# Patient Record
Sex: Male | Born: 1960 | Race: White | Hispanic: No | Marital: Single | Smoking: Never smoker
Health system: Southern US, Community
[De-identification: ages and names within clinical notes are randomized; demographics above are authoritative.]

## PROBLEM LIST (undated history)

## (undated) DIAGNOSIS — E559 Vitamin D deficiency, unspecified: Secondary | ICD-10-CM

## (undated) DIAGNOSIS — B20 Human immunodeficiency virus [HIV] disease: Secondary | ICD-10-CM

## (undated) DIAGNOSIS — E785 Hyperlipidemia, unspecified: Secondary | ICD-10-CM

## (undated) DIAGNOSIS — E119 Type 2 diabetes mellitus without complications: Secondary | ICD-10-CM

## (undated) DIAGNOSIS — Z21 Asymptomatic human immunodeficiency virus [HIV] infection status: Secondary | ICD-10-CM

## (undated) HISTORY — DX: Hyperlipidemia, unspecified: E78.5

## (undated) HISTORY — DX: Type 2 diabetes mellitus without complications: E11.9

## (undated) HISTORY — DX: Human immunodeficiency virus (HIV) disease: B20

## (undated) HISTORY — DX: Asymptomatic human immunodeficiency virus (hiv) infection status: Z21

## (undated) HISTORY — PX: TONSILLECTOMY AND ADENOIDECTOMY: SHX28

## (undated) HISTORY — DX: Vitamin D deficiency, unspecified: E55.9

---

## 2013-05-10 ENCOUNTER — Other Ambulatory Visit: Payer: Self-pay

## 2013-05-10 ENCOUNTER — Telehealth: Payer: Self-pay

## 2013-05-10 DIAGNOSIS — B20 Human immunodeficiency virus [HIV] disease: Secondary | ICD-10-CM

## 2013-05-10 MED ORDER — EMTRICITABINE-TENOFOVIR DF 200-300 MG PO TABS: 1.0000 | ORAL_TABLET | Freq: Every day | ORAL | Status: DC

## 2013-05-10 MED ORDER — RALTEGRAVIR POTASSIUM 400 MG PO TABS: 400.0000 mg | ORAL_TABLET | Freq: Two times a day (BID) | ORAL | Status: DC

## 2013-05-10 NOTE — Telephone Encounter (Signed)
Pt calling to see if medical records have been received from [facility].  He is requesting to establish with a physician in our clinic. He has had several problem with previous providers and states we are the next closest ID clinic.  He only has 7 days of ART and his last office visit with ID was at least a year ago.   Pt is taking Truvada and Isentress. I have offered an intake appointment and patient says he will be traveling from [place] and does want to commute twice in one month for appointments.  I have scheduled him with Dr Luciana Axe since we have medical records and will send a 30 day script to pharmacy . He will need to have labs done at appointment and make arrangements with Dr Luciana Axe regarding future visits and labs.

## 2013-05-10 NOTE — Telephone Encounter (Signed)
Medications needed prior to visit with Dr Luciana Axe on 05-26-2013.

## 2013-05-26 ENCOUNTER — Encounter: Payer: Self-pay | Admitting: Internal Medicine

## 2013-05-26 ENCOUNTER — Ambulatory Visit (INDEPENDENT_AMBULATORY_CARE_PROVIDER_SITE_OTHER): Payer: BC Managed Care – PPO | Admitting: Internal Medicine

## 2013-05-26 VITALS — BP 143/87 | HR 88 | Temp 98.6°F | Ht 69.0 in | Wt 140.0 lb

## 2013-05-26 DIAGNOSIS — B20 Human immunodeficiency virus [HIV] disease: Secondary | ICD-10-CM | POA: Insufficient documentation

## 2013-05-26 DIAGNOSIS — E785 Hyperlipidemia, unspecified: Secondary | ICD-10-CM

## 2013-05-26 DIAGNOSIS — E119 Type 2 diabetes mellitus without complications: Secondary | ICD-10-CM | POA: Insufficient documentation

## 2013-05-26 DIAGNOSIS — E1169 Type 2 diabetes mellitus with other specified complication: Secondary | ICD-10-CM | POA: Insufficient documentation

## 2013-05-26 DIAGNOSIS — I10 Essential (primary) hypertension: Secondary | ICD-10-CM

## 2013-05-26 DIAGNOSIS — Z113 Encounter for screening for infections with a predominantly sexual mode of transmission: Secondary | ICD-10-CM

## 2013-05-26 LAB — COMPLETE METABOLIC PANEL WITH GFR
ALT: 53 U/L (ref 0–53)
AST: 33 U/L (ref 0–37)
Albumin: 5.2 g/dL (ref 3.5–5.2)
Alkaline Phosphatase: 54 U/L (ref 39–117)
Calcium: 10.6 mg/dL — ABNORMAL HIGH (ref 8.4–10.5)
Chloride: 102 mEq/L (ref 96–112)
GFR, Est Non African American: 76 mL/min
Glucose, Bld: 175 mg/dL — ABNORMAL HIGH (ref 70–99)
Potassium: 4.5 mEq/L (ref 3.5–5.3)
Sodium: 136 mEq/L (ref 135–145)

## 2013-05-26 MED ORDER — EMTRICITABINE-TENOFOVIR DF 200-300 MG PO TABS: 1.0000 | ORAL_TABLET | Freq: Every day | ORAL | Status: DC

## 2013-05-26 MED ORDER — RALTEGRAVIR POTASSIUM 400 MG PO TABS: 400.0000 mg | ORAL_TABLET | Freq: Two times a day (BID) | ORAL | Status: DC

## 2013-05-26 NOTE — Assessment & Plan Note (Signed)
His blood pressure is mildly elevated today. He'll monitor this and discuss with his primary doctor

## 2013-05-26 NOTE — Assessment & Plan Note (Signed)
He has done well with this regimen though has not had labs in quite some time. I will then check his labs today if there is any concerns she will be called. Otherwise she will return in about 4 months. I did refill his medications for 6 months. We'll check hepatitis A and B. Antibody to see if he does have some vaccination otherwise he will start the series next visit. We'll also have him send for mail Pap smear if he has not had that done by his primary doctor.

## 2013-05-26 NOTE — Assessment & Plan Note (Signed)
This is managed by his primary doctor so I will not monitor it here

## 2013-05-26 NOTE — Progress Notes (Signed)
  Subjective:    Patient ID: Cameron Humphrey, male    DOB: Feb 03, 1961, 52 y.o.   MRN: [id number]  HPI He comes in to establish care as a new patient. He has a long history of HIV from 51 History of early regimens but has done very well. She has no history of other sexual transmitted infections. No history of opportunistic infections and no history of thrush. He previously was on multiple regimens until getting on Isentress with Truvada and Lexiva.  It is unclear why he had 4 drug therapy with no known history of resistance but do 2 hyperlipidemia the Lexiva was dropped more than one year ago. He continues to take his 2 medications including the twice a day Isentress with no issues. He denies any missed doses. His last labs were done at [facility] infectious diseases and a CD4 count was 262 though viral load did not seem to get done. He left that clinic and has not been seen since August of 2013-2 and not liking the clinic and front staff. She does not recall any history of hepatitis A or B. Vaccine though does recall multiple vaccines and has had a recent Pneumovax. She does not get flu shots since he feels they make him sick. He has no hospitalizations. He otherwise has diabetes which he attributes to his increased tightness of the head in 2002 a stem from earlier HIV drugs. He also has hypertension. His primary care doctor is Dr. Layla Barter at [facility] family [facility] who manages his other issues. I do not see any vaccination history of hepatitis A and B. From his primary doctor although they did note that he was in a [place] that sounds like has not been back recently. He does travel from [place] so if his labs at the same time as his visit.   Review of Systems  Constitutional: Negative for fever, chills and fatigue.  HENT: Negative for trouble swallowing.   Eyes: Negative for visual disturbance.  Respiratory: Negative for cough and shortness of breath.   Cardiovascular: Negative for chest pain.  Gastrointestinal:  Negative for diarrhea and constipation.  Endocrine: Negative for polyuria.  Musculoskeletal: Negative for back pain.  Skin: Negative for rash.  Neurological: Negative for dizziness, light-headedness and headaches.  Hematological: Negative for adenopathy.  Psychiatric/Behavioral: Negative for sleep disturbance and dysphoric mood.       Objective:   Physical Exam  Constitutional: He is oriented to person, place, and time. He appears well-developed and well-nourished. No distress.  HENT:  He has temporal wasting  Eyes: Right eye exhibits no discharge. Left eye exhibits no discharge. No scleral icterus.  Cardiovascular: Normal rate, regular rhythm and normal heart sounds.   No murmur heard. Pulmonary/Chest: Effort normal and breath sounds normal. No respiratory distress. He has no wheezes.  Abdominal: Soft. Bowel sounds are normal. He exhibits no distension and no mass. There is no tenderness. There is no rebound.  Lymphadenopathy:    He has no cervical adenopathy.  Neurological: He is alert and oriented to person, place, and time.  Skin: Skin is warm and dry. No rash noted.  Psychiatric: He has a normal mood and affect. His behavior is normal.          Assessment & Plan:

## 2013-05-27 LAB — CBC WITH DIFFERENTIAL/PLATELET
Basophils Absolute: 0 10*3/uL (ref 0.0–0.1)
Eosinophils Absolute: 0.2 10*3/uL (ref 0.0–0.7)
Eosinophils Relative: 2 % (ref 0–5)
HCT: 42.7 % (ref 39.0–52.0)
Hemoglobin: 15.3 g/dL (ref 13.0–17.0)
Lymphocytes Relative: 19 % (ref 12–46)
Lymphs Abs: 1.3 10*3/uL (ref 0.7–4.0)
MCH: 32.3 pg (ref 26.0–34.0)
MCV: 90.3 fL (ref 78.0–100.0)
Monocytes Absolute: 0.6 10*3/uL (ref 0.1–1.0)
Monocytes Relative: 8 % (ref 3–12)
Platelets: 186 10*3/uL (ref 150–400)
RDW: 13.9 % (ref 11.5–15.5)
WBC: 6.8 10*3/uL (ref 4.0–10.5)

## 2013-05-27 LAB — HEPATITIS B SURFACE ANTIBODY,QUALITATIVE: Hep B S Ab: NEGATIVE

## 2013-05-27 LAB — TOXOPLASMA GONDII ANTIBODY, IGG: Toxoplasma IgG Ratio: <3 IU/mL (ref <7.2)

## 2013-05-27 LAB — HEPATITIS B SURFACE ANTIGEN: Hepatitis B Surface Ag: NEGATIVE

## 2013-05-27 LAB — T-HELPER CELL (CD4) - (RCID CLINIC ONLY): CD4 T Cell Abs: 280 /uL — ABNORMAL LOW (ref 400–2700)

## 2013-05-27 LAB — HEPATITIS A ANTIBODY, TOTAL: Hep A Total Ab: NONREACTIVE

## 2013-05-28 LAB — HIV-1 RNA QUANT-NO REFLEX-BLD
HIV 1 RNA Quant: <20 copies/mL (ref <20)
HIV-1 RNA Quant, Log: <1.3 {Log} (ref <1.30)

## 2013-05-28 LAB — QUANTIFERON TB GOLD ASSAY (BLOOD)
Interferon Gamma Release Assay: NEGATIVE
Mitogen value: 2.99 IU/mL
Quantiferon Nil Value: 0.02 IU/mL
Quantiferon Tb Ag Minus Nil Value: 0.01 IU/mL

## 2013-07-16 ENCOUNTER — Encounter: Payer: Self-pay | Admitting: Internal Medicine

## 2013-08-13 ENCOUNTER — Encounter: Payer: Self-pay | Admitting: *Deleted

## 2013-08-18 ENCOUNTER — Other Ambulatory Visit: Payer: Self-pay | Admitting: Licensed Clinical Social Worker

## 2013-08-18 ENCOUNTER — Encounter: Payer: Self-pay | Admitting: Internal Medicine

## 2013-08-18 DIAGNOSIS — B20 Human immunodeficiency virus [HIV] disease: Secondary | ICD-10-CM

## 2013-08-18 MED ORDER — RALTEGRAVIR POTASSIUM 400 MG PO TABS: 400.0000 mg | ORAL_TABLET | Freq: Two times a day (BID) | ORAL | Status: DC

## 2013-08-18 MED ORDER — EMTRICITABINE-TENOFOVIR DF 200-300 MG PO TABS
1.0000 | ORAL_TABLET | Freq: Every day | ORAL | Status: DC
Start: 2013-08-18 — End: 2013-09-13

## 2013-08-26 ENCOUNTER — Ambulatory Visit (INDEPENDENT_AMBULATORY_CARE_PROVIDER_SITE_OTHER): Payer: BC Managed Care – PPO | Admitting: Internal Medicine

## 2013-08-26 ENCOUNTER — Other Ambulatory Visit: Payer: Self-pay | Admitting: *Deleted

## 2013-08-26 VITALS — BP 124/83 | HR 76 | Temp 97.8°F | Wt 140.0 lb

## 2013-08-26 DIAGNOSIS — B20 Human immunodeficiency virus [HIV] disease: Secondary | ICD-10-CM

## 2013-08-26 NOTE — Progress Notes (Signed)
   Subjective:    Patient ID: Cameron Humphrey, male    DOB: 01/03/1961, 53 y.o.   MRN: [id number]  HPI  Here for follow up for HIV.  On Isentress and Truvada and denies any missed doses.  Feels well.  No weight loss, no diarrhea.  Happy with his regimen and not interested in changing to a more convenient regimen.     Review of Systems  Constitutional: Negative for fever and fatigue.  HENT: Negative for trouble swallowing.   Gastrointestinal: Negative for nausea and diarrhea.  Skin: Negative for rash.  Neurological: Negative for dizziness and light-headedness.  Hematological: Negative for adenopathy.       Objective:   Physical Exam  Constitutional: He appears well-developed and well-nourished. No distress.  HENT:  Some temporal wasting that is chronic  Eyes: No scleral icterus.  Cardiovascular: Normal rate, regular rhythm and normal heart sounds.   No murmur heard. Pulmonary/Chest: Effort normal and breath sounds normal. No respiratory distress. He has no wheezes.  Musculoskeletal: He exhibits no edema.  Lymphadenopathy:    He has no cervical adenopathy.  Skin: No rash noted.          Assessment & Plan:   

## 2013-08-26 NOTE — Assessment & Plan Note (Signed)
Doing well.  Labs today and rtc 4 months unless there are concerns.

## 2013-08-27 LAB — URINE CYTOLOGY ANCILLARY ONLY
Chlamydia: NEGATIVE
Neisseria Gonorrhea: NEGATIVE

## 2013-08-27 LAB — T-HELPER CELL (CD4) - (RCID CLINIC ONLY)
CD4 T CELL HELPER: 22 % — AB (ref 33–55)
CD4 T Cell Abs: 350 /uL — ABNORMAL LOW (ref 400–2700)

## 2013-08-30 ENCOUNTER — Encounter: Payer: Self-pay | Admitting: Internal Medicine

## 2013-08-30 LAB — HIV-1 RNA QUANT-NO REFLEX-BLD
HIV 1 RNA Quant: <20 copies/mL (ref <20)
HIV-1 RNA Quant, Log: <1.3 {Log} (ref <1.30)

## 2013-09-13 ENCOUNTER — Other Ambulatory Visit: Payer: Self-pay | Admitting: *Deleted

## 2013-09-13 ENCOUNTER — Encounter: Payer: Self-pay | Admitting: Internal Medicine

## 2013-09-13 DIAGNOSIS — B20 Human immunodeficiency virus [HIV] disease: Secondary | ICD-10-CM

## 2013-09-13 MED ORDER — RALTEGRAVIR POTASSIUM 400 MG PO TABS: 400.0000 mg | ORAL_TABLET | Freq: Two times a day (BID) | ORAL | Status: DC

## 2013-09-13 MED ORDER — EMTRICITABINE-TENOFOVIR DF 200-300 MG PO TABS: 1.0000 | ORAL_TABLET | Freq: Every day | ORAL | Status: DC

## 2013-12-29 ENCOUNTER — Encounter: Payer: Self-pay | Admitting: Internal Medicine

## 2013-12-29 ENCOUNTER — Ambulatory Visit (INDEPENDENT_AMBULATORY_CARE_PROVIDER_SITE_OTHER): Payer: BC Managed Care – PPO | Admitting: Internal Medicine

## 2013-12-29 VITALS — BP 162/94 | HR 77 | Temp 98.3°F | Ht 69.0 in | Wt 132.0 lb

## 2013-12-29 DIAGNOSIS — E785 Hyperlipidemia, unspecified: Secondary | ICD-10-CM | POA: Diagnosis not present

## 2013-12-29 DIAGNOSIS — Z23 Encounter for immunization: Secondary | ICD-10-CM

## 2013-12-29 DIAGNOSIS — B20 Human immunodeficiency virus [HIV] disease: Secondary | ICD-10-CM | POA: Diagnosis not present

## 2013-12-29 NOTE — Assessment & Plan Note (Signed)
Lipid panel followed by his PCP

## 2013-12-29 NOTE — Progress Notes (Signed)
   Subjective:    Patient ID: Cameron Humphrey, male    DOB: 05/23/1961, 53 y.o.   MRN: [id number]  HPI  Here for follow up for HIV.  On Isentress and Truvada and denies any missed doses.  Feels well.  No weight loss, no diarrhea.  Happy with his regimen and not interested in changing to a more convenient regimen.     Review of Systems  Constitutional: Negative for fever and fatigue.  HENT: Negative for trouble swallowing.   Gastrointestinal: Negative for nausea and diarrhea.  Skin: Negative for rash.  Neurological: Negative for dizziness and light-headedness.  Hematological: Negative for adenopathy.       Objective:   Physical Exam  Constitutional: He appears well-developed and well-nourished. No distress.  HENT:  Some temporal wasting that is chronic  Eyes: No scleral icterus.  Cardiovascular: Normal rate, regular rhythm and normal heart sounds.   No murmur heard. Pulmonary/Chest: Effort normal and breath sounds normal. No respiratory distress. He has no wheezes.  Musculoskeletal: He exhibits no edema.  Lymphadenopathy:    He has no cervical adenopathy.  Skin: No rash noted.          Assessment & Plan:

## 2013-12-29 NOTE — Assessment & Plan Note (Addendum)
Doing well.  Labs today and rtc 3 months unless there are concerns.   Hep A and B series starting today.   Will space it out to every 6 months if stable next visit.

## 2013-12-30 LAB — T-HELPER CELL (CD4) - (RCID CLINIC ONLY)
CD4 T CELL ABS: 330 /uL — AB (ref 400–2700)
CD4 T CELL HELPER: 23 % — AB (ref 33–55)

## 2013-12-31 LAB — HIV-1 RNA QUANT-NO REFLEX-BLD: HIV-1 RNA Quant, Log: <1.3 {Log} (ref <1.30)

## 2014-01-26 ENCOUNTER — Encounter: Payer: Self-pay | Admitting: Internal Medicine

## 2014-02-22 ENCOUNTER — Ambulatory Visit (INDEPENDENT_AMBULATORY_CARE_PROVIDER_SITE_OTHER): Payer: BC Managed Care – PPO | Admitting: *Deleted

## 2014-02-22 DIAGNOSIS — Z23 Encounter for immunization: Secondary | ICD-10-CM

## 2014-02-26 ENCOUNTER — Encounter: Payer: Self-pay | Admitting: Internal Medicine

## 2014-03-31 ENCOUNTER — Ambulatory Visit (INDEPENDENT_AMBULATORY_CARE_PROVIDER_SITE_OTHER): Payer: BC Managed Care – PPO | Admitting: Internal Medicine

## 2014-03-31 ENCOUNTER — Encounter: Payer: Self-pay | Admitting: Internal Medicine

## 2014-03-31 VITALS — BP 127/83 | HR 87 | Temp 97.4°F | Wt 131.0 lb

## 2014-03-31 DIAGNOSIS — B20 Human immunodeficiency virus [HIV] disease: Secondary | ICD-10-CM

## 2014-03-31 DIAGNOSIS — E119 Type 2 diabetes mellitus without complications: Secondary | ICD-10-CM | POA: Diagnosis not present

## 2014-03-31 DIAGNOSIS — Z79899 Other long term (current) drug therapy: Secondary | ICD-10-CM

## 2014-03-31 DIAGNOSIS — Z113 Encounter for screening for infections with a predominantly sexual mode of transmission: Secondary | ICD-10-CM | POA: Diagnosis not present

## 2014-03-31 MED ORDER — DOLUTEGRAVIR SODIUM 50 MG PO TABS: 50.0000 mg | ORAL_TABLET | Freq: Every day | ORAL | Status: DC

## 2014-03-31 MED ORDER — EMTRICITABINE-TENOFOVIR DF 200-300 MG PO TABS: 1.0000 | ORAL_TABLET | Freq: Every day | ORAL | Status: DC

## 2014-03-31 NOTE — Assessment & Plan Note (Signed)
Discussed regimen change and will change to Tivicay with truvada with next refill.  Has been sent.  RTC 4 months.

## 2014-03-31 NOTE — Progress Notes (Signed)
   Subjective:    Patient ID: Cameron Humphrey, male    DOB: 03-20-1961, 53 y.o.   MRN: [id number]  HPI Here for follow up for HIV.  On Isentress and Truvada and has occasional missed doses.  Feels well.  No weight loss, no diarrhea. Having trouble with low carb diet and not sure what variety he can eat.  Is interested in streamlining his regimen.       Review of Systems  Constitutional: Negative for fever and fatigue.  HENT: Negative for trouble swallowing.   Gastrointestinal: Negative for nausea and diarrhea.  Skin: Negative for rash.  Neurological: Negative for dizziness and light-headedness.  Hematological: Negative for adenopathy.       Objective:   Physical Exam  Constitutional: He appears well-developed and well-nourished. No distress.  HENT:  Some temporal wasting that is chronic  Eyes: No scleral icterus.  Cardiovascular: Normal rate, regular rhythm and normal heart sounds.   No murmur heard. Pulmonary/Chest: Effort normal and breath sounds normal. No respiratory distress. He has no wheezes.  Musculoskeletal: He exhibits no edema.  Lymphadenopathy:    He has no cervical adenopathy.  Skin: No rash noted.          Assessment & Plan:

## 2014-03-31 NOTE — Assessment & Plan Note (Signed)
Will refer to nutritionist.  

## 2014-04-01 ENCOUNTER — Telehealth: Payer: Self-pay | Admitting: *Deleted

## 2014-04-01 ENCOUNTER — Other Ambulatory Visit: Payer: Self-pay | Admitting: Internal Medicine

## 2014-04-01 LAB — CBC WITH DIFFERENTIAL/PLATELET
BASOS ABS: 0 10*3/uL (ref 0.0–0.1)
Basophils Relative: 0 % (ref 0–1)
Eosinophils Absolute: 0.2 10*3/uL (ref 0.0–0.7)
Eosinophils Relative: 5 % (ref 0–5)
HCT: 39.6 % (ref 39.0–52.0)
Hemoglobin: 13.7 g/dL (ref 13.0–17.0)
LYMPHS PCT: 25 % (ref 12–46)
Lymphs Abs: 1.2 10*3/uL (ref 0.7–4.0)
MCH: 31.6 pg (ref 26.0–34.0)
MCHC: 34.6 g/dL (ref 30.0–36.0)
MCV: 91.2 fL (ref 78.0–100.0)
Monocytes Absolute: 0.6 10*3/uL (ref 0.1–1.0)
Monocytes Relative: 13 % — ABNORMAL HIGH (ref 3–12)
NEUTROS ABS: 2.7 10*3/uL (ref 1.7–7.7)
NEUTROS PCT: 57 % (ref 43–77)
PLATELETS: 211 10*3/uL (ref 150–400)
RBC: 4.34 MIL/uL (ref 4.22–5.81)
RDW: 13.4 % (ref 11.5–15.5)
WBC: 4.7 10*3/uL (ref 4.0–10.5)

## 2014-04-01 LAB — COMPLETE METABOLIC PANEL WITH GFR
ALBUMIN: 4.9 g/dL (ref 3.5–5.2)
ALT: 25 U/L (ref 0–53)
AST: 20 U/L (ref 0–37)
Alkaline Phosphatase: 44 U/L (ref 39–117)
BUN: 25 mg/dL — AB (ref 6–23)
CALCIUM: 9.8 mg/dL (ref 8.4–10.5)
CO2: 22 mEq/L (ref 19–32)
CREATININE: 0.97 mg/dL (ref 0.50–1.35)
Chloride: 102 mEq/L (ref 96–112)
GFR, Est African American: >89 mL/min
GLUCOSE: 134 mg/dL — AB (ref 70–99)
POTASSIUM: 4.2 meq/L (ref 3.5–5.3)
Sodium: 135 mEq/L (ref 135–145)
Total Bilirubin: 0.6 mg/dL (ref 0.2–1.2)
Total Protein: 7.5 g/dL (ref 6.0–8.3)

## 2014-04-01 LAB — T-HELPER CELL (CD4) - (RCID CLINIC ONLY)
CD4 % Helper T Cell: 24 % — ABNORMAL LOW (ref 33–55)
CD4 T Cell Abs: 280 /uL — ABNORMAL LOW (ref 400–2700)

## 2014-04-01 LAB — RPR

## 2014-04-01 NOTE — Telephone Encounter (Signed)
Referral made to [facility]. They will call the patient to arrange date and time. Cameron Humphrey

## 2014-04-04 ENCOUNTER — Other Ambulatory Visit: Payer: Self-pay | Admitting: *Deleted

## 2014-04-04 LAB — HIV-1 RNA QUANT-NO REFLEX-BLD: HIV-1 RNA Quant, Log: <1.3 {Log} (ref <1.30)

## 2014-04-04 MED ORDER — ELVITEG-COBIC-EMTRICIT-TENOFDF 150-150-200-300 MG PO TABS: 1.0000 | ORAL_TABLET | Freq: Every day | ORAL | Status: DC

## 2014-04-06 LAB — HLA B*5701: HLA-B 5701 W/RFLX HLA-B HIGH: NEGATIVE

## 2014-04-07 ENCOUNTER — Encounter: Payer: Self-pay | Admitting: Internal Medicine

## 2014-04-12 ENCOUNTER — Encounter: Payer: BC Managed Care – PPO | Attending: Internal Medicine | Admitting: *Deleted

## 2014-04-12 ENCOUNTER — Encounter: Payer: Self-pay | Admitting: Internal Medicine

## 2014-04-12 ENCOUNTER — Encounter: Payer: Self-pay | Admitting: *Deleted

## 2014-04-12 VITALS — Ht 69.0 in | Wt 140.0 lb

## 2014-04-12 DIAGNOSIS — E119 Type 2 diabetes mellitus without complications: Secondary | ICD-10-CM | POA: Diagnosis present

## 2014-04-12 DIAGNOSIS — Z713 Dietary counseling and surveillance: Secondary | ICD-10-CM | POA: Diagnosis not present

## 2014-04-12 NOTE — Patient Instructions (Signed)
Plan:  Aim for 3 Carb Choices per meal (45 grams) +/- 1 either way  Aim for 0-2 Carbs per snack if hungry  Include protein in moderation with your meals and snacks Consider reading food labels for Total Carbohydrate of foods Continue with your activity level by walking for 2 miles daily as tolerated Continue taking medication as directed by MD

## 2014-04-20 NOTE — Progress Notes (Signed)
Diabetes Self-Management Education  Visit Type:  Initial  Appt. Start Time: 1400 Appt. End Time: 1530  04/20/2014  Mr. Cameron Humphrey, identified by name and date of birth, is a 53 y.o. male with a diagnosis of Diabetes: Type 2.  Other people present during visit:  Patient   ASSESSMENT  Height 5\' 9"  (1.753 m), weight 140 lb (63.504 kg). Body mass index is 20.67 kg/(m^2).  Initial Visit Information:  Are you currently following a meal plan?: No   Are you taking your medications as prescribed?: Yes Are you checking your feet?: Yes How many days per week are you checking your feet?: 5      Psychosocial:     Patient Belief/Attitude about Diabetes: Motivated to manage diabetes Self-care barriers: None Other persons present: Patient Patient Concerns: Nutrition/Meal planning;Medication Special Needs: None  Complications:   Last HgB A1C per patient/outside source: 6.7 mg/dL How often do you check your blood sugar?: 0 times/day (not testing) Have you had a dilated eye exam in the past 12 months?: Yes Have you had a dental exam in the past 12 months?: Yes  Diet Intake:  Breakfast: skip except for 2 cups of coffee with Splenda and fat free sugar free creamer Lunch: snack crackers or other food he might be interested in that day. Dinner: enjoys meal salads with lean meat, soups in the winter time, occasionally casserole with low GI. prefers whole grain flours, etc Beverage(s): coffee, water, [employer] water  Exercise:  Exercise: Light (walking / raking leaves) Light Exercise amount of time (min / week): 150  Individualized Plan for Diabetes Self-Management Training:   Learning Objective:  Patient will have a greater understanding of diabetes self-management.  Patient education plan per assessed needs and concerns is to attend individual sessions for     Education Topics Reviewed with Patient Today:  Definition of diabetes, type 1 and 2, and the diagnosis of  diabetes Role of diet in the treatment of diabetes and the relationship between the three main macronutrients and blood glucose level;Carbohydrate counting;Food label reading, portion sizes and measuring food. Role of exercise on diabetes management, blood pressure control and cardiac health. Reviewed patients medication for diabetes, action, purpose, timing of dose and side effects. Identified appropriate SMBG and/or A1C goals. Taught treatment of hypoglycemia - the 15 rule.   Role of stress on diabetes      PATIENTS GOALS/Plan (Developed by the patient):  Nutrition: Follow meal plan discussed Physical Activity: Exercise 3-5 times per week Medications: take my medication as prescribed Monitoring : test blood glucose pre and post meals as discussed  Plan:   Patient Instructions  Plan:  Aim for 3 Carb Choices per meal (45 grams) +/- 1 either way  Aim for 0-2 Carbs per snack if hungry  Include protein in moderation with your meals and snacks Consider reading food labels for Total Carbohydrate of foods Continue with your activity level by walking for 2 miles daily as tolerated Continue taking medication as directed by MD      Expected Outcomes:  Demonstrated interest in learning. Expect positive outcomes  Education material provided: Living Well with Diabetes, Food label handouts, A1C conversion sheet, Meal plan card and Carbohydrate counting sheet  If problems or questions, patient to contact team via:  Email  Future DSME appointment: 4-6 wks

## 2014-05-16 ENCOUNTER — Encounter: Payer: Self-pay | Admitting: Internal Medicine

## 2014-05-16 ENCOUNTER — Other Ambulatory Visit: Payer: Self-pay | Admitting: Internal Medicine

## 2014-05-16 MED ORDER — DOLUTEGRAVIR SODIUM 50 MG PO TABS: 50.0000 mg | ORAL_TABLET | Freq: Every day | ORAL | Status: DC

## 2014-05-16 MED ORDER — EMTRICITABINE-TENOFOVIR DF 200-300 MG PO TABS: 1.0000 | ORAL_TABLET | Freq: Every day | ORAL | Status: DC

## 2014-05-17 NOTE — Progress Notes (Signed)
Patient notified and he will pick up the Truvada and Tivicay today.

## 2014-05-31 ENCOUNTER — Ambulatory Visit: Payer: BC Managed Care – PPO | Admitting: *Deleted

## 2014-07-26 ENCOUNTER — Encounter: Payer: Self-pay | Admitting: Internal Medicine

## 2014-08-02 ENCOUNTER — Ambulatory Visit: Payer: BLUE CROSS/BLUE SHIELD | Admitting: Internal Medicine

## 2014-08-03 ENCOUNTER — Encounter: Payer: Self-pay | Admitting: Internal Medicine

## 2014-08-03 ENCOUNTER — Ambulatory Visit (INDEPENDENT_AMBULATORY_CARE_PROVIDER_SITE_OTHER): Payer: BLUE CROSS/BLUE SHIELD | Admitting: Internal Medicine

## 2014-08-03 VITALS — BP 146/81 | HR 77 | Temp 98.4°F | Ht 69.0 in | Wt 137.0 lb

## 2014-08-03 DIAGNOSIS — Z23 Encounter for immunization: Secondary | ICD-10-CM

## 2014-08-03 DIAGNOSIS — B20 Human immunodeficiency virus [HIV] disease: Secondary | ICD-10-CM | POA: Diagnosis not present

## 2014-08-03 NOTE — Addendum Note (Signed)
Addended by: Andree CossHOWELL, Alyvia Derk M on: 08/03/2014 03:36 PM   Modules accepted: Orders

## 2014-08-03 NOTE — Assessment & Plan Note (Signed)
He is doing well with his regimen of Tivicay and Truvada. No new issues. He'll get his CD4 and viral load today. Other labs were done by his primary physician and were reviewed and scanned. He will return in 4 months unless problems. He will get hep A #2, hep B #3, and Pneumovax today. He is due for a tetanus shot may consider that next time.

## 2014-08-03 NOTE — Progress Notes (Signed)
   Subjective:    Patient ID: Cameron MurrayJames Humphrey, male    DOB: 07/22/1960, 54 y.o.   MRN: [id number]  HPI Here for follow up for HIV.  On Isentress and Truvada and has occasional missed doses.  Feels well.  No weight loss, no diarrhea. Having trouble with low carb diet and not sure what variety he can eat.  Is interested in streamlining his regimen.       Review of Systems  Constitutional: Negative for fever and fatigue.  HENT: Negative for trouble swallowing.   Gastrointestinal: Negative for nausea and diarrhea.  Skin: Negative for rash.  Neurological: Negative for dizziness and light-headedness.  Hematological: Negative for adenopathy.       Objective:   Physical Exam  Constitutional: He appears well-developed and well-nourished. No distress.  HENT:  Some temporal wasting that is chronic  Eyes: No scleral icterus.  Cardiovascular: Normal rate, regular rhythm and normal heart sounds.   No murmur heard. Pulmonary/Chest: Effort normal and breath sounds normal. No respiratory distress. He has no wheezes.  Musculoskeletal: He exhibits no edema.  Lymphadenopathy:    He has no cervical adenopathy.  Skin: No rash noted.          Assessment & Plan:

## 2014-08-04 LAB — T-HELPER CELL (CD4) - (RCID CLINIC ONLY)
CD4 % Helper T Cell: 20 % — ABNORMAL LOW (ref 33–55)
CD4 T CELL ABS: 240 /uL — AB (ref 400–2700)

## 2014-08-05 LAB — HIV-1 RNA QUANT-NO REFLEX-BLD: HIV 1 RNA Quant: <20 copies/mL (ref <20)

## 2014-08-16 ENCOUNTER — Encounter: Payer: Self-pay | Admitting: Internal Medicine

## 2014-08-30 ENCOUNTER — Ambulatory Visit: Payer: BLUE CROSS/BLUE SHIELD | Admitting: Internal Medicine

## 2014-09-14 ENCOUNTER — Encounter: Payer: Self-pay | Admitting: Internal Medicine

## 2014-12-07 ENCOUNTER — Ambulatory Visit (INDEPENDENT_AMBULATORY_CARE_PROVIDER_SITE_OTHER): Payer: BLUE CROSS/BLUE SHIELD | Admitting: Internal Medicine

## 2014-12-07 ENCOUNTER — Encounter: Payer: Self-pay | Admitting: Internal Medicine

## 2014-12-07 VITALS — BP 129/83 | HR 72 | Temp 98.1°F | Wt 131.0 lb

## 2014-12-07 DIAGNOSIS — B20 Human immunodeficiency virus [HIV] disease: Secondary | ICD-10-CM | POA: Diagnosis not present

## 2014-12-07 NOTE — Assessment & Plan Note (Signed)
Doing well. Labs today and if no issues rtc in 6 months.

## 2014-12-07 NOTE — Progress Notes (Signed)
   Subjective:    Patient ID: Cameron MurrayJames Lippert, male    DOB: 07/25/1960, 54 y.o.   MRN: [id number]  HPI Here for follow up for HIV.  On Tivicay and Truvada and has no missed doses.  Feels well.  No weight loss, no diarrhea. Seeing an endocrinologist for his DM.  Brought labs in today for review. Gets CD4 and viral load done here and all other labs done by PCP/endo.  No new issues.  [place] and planning to build a house and move there in a year or so.    Review of Systems  Constitutional: Negative for fever and fatigue.  HENT: Negative for trouble swallowing.   Gastrointestinal: Negative for nausea and diarrhea.  Skin: Negative for rash.  Neurological: Negative for dizziness and light-headedness.  Hematological: Negative for adenopathy.       Objective:   Physical Exam  Constitutional: He appears well-developed and well-nourished. No distress.  HENT:  Some temporal wasting that is chronic  Eyes: No scleral icterus.  Cardiovascular: Normal rate, regular rhythm and normal heart sounds.   No murmur heard. Pulmonary/Chest: Effort normal and breath sounds normal. No respiratory distress. He has no wheezes.  Musculoskeletal: He exhibits no edema.  Lymphadenopathy:    He has no cervical adenopathy.  Skin: No rash noted.          Assessment & Plan:

## 2014-12-08 LAB — T-HELPER CELL (CD4) - (RCID CLINIC ONLY)
CD4 T CELL ABS: 260 /uL — AB (ref 400–2700)
CD4 T CELL HELPER: 19 % — AB (ref 33–55)

## 2014-12-09 LAB — HIV-1 RNA QUANT-NO REFLEX-BLD: HIV 1 RNA Quant: <20 copies/mL (ref <20)

## 2015-03-09 ENCOUNTER — Encounter: Payer: Self-pay | Admitting: Internal Medicine

## 2015-03-10 ENCOUNTER — Other Ambulatory Visit: Payer: Self-pay | Admitting: Internal Medicine

## 2015-03-10 MED ORDER — EMTRICITABINE-TENOFOVIR DF 200-300 MG PO TABS: 1.0000 | ORAL_TABLET | Freq: Every day | ORAL | Status: DC

## 2015-05-07 ENCOUNTER — Other Ambulatory Visit: Payer: Self-pay | Admitting: Internal Medicine

## 2015-05-08 ENCOUNTER — Other Ambulatory Visit: Payer: Self-pay | Admitting: *Deleted

## 2015-05-08 MED ORDER — DOLUTEGRAVIR SODIUM 50 MG PO TABS: 50.0000 mg | ORAL_TABLET | Freq: Every day | ORAL | Status: DC

## 2015-05-08 MED ORDER — EMTRICITABINE-TENOFOVIR DF 200-300 MG PO TABS: 1.0000 | ORAL_TABLET | Freq: Every day | ORAL | Status: DC

## 2015-06-06 ENCOUNTER — Ambulatory Visit (INDEPENDENT_AMBULATORY_CARE_PROVIDER_SITE_OTHER): Payer: BLUE CROSS/BLUE SHIELD | Admitting: Internal Medicine

## 2015-06-06 ENCOUNTER — Encounter: Payer: Self-pay | Admitting: Internal Medicine

## 2015-06-06 VITALS — BP 128/80 | HR 86 | Temp 98.1°F | Wt 141.0 lb

## 2015-06-06 DIAGNOSIS — E1169 Type 2 diabetes mellitus with other specified complication: Secondary | ICD-10-CM | POA: Diagnosis not present

## 2015-06-06 DIAGNOSIS — B20 Human immunodeficiency virus [HIV] disease: Secondary | ICD-10-CM | POA: Diagnosis not present

## 2015-06-06 DIAGNOSIS — E785 Hyperlipidemia, unspecified: Secondary | ICD-10-CM | POA: Diagnosis not present

## 2015-06-06 DIAGNOSIS — I10 Essential (primary) hypertension: Secondary | ICD-10-CM | POA: Diagnosis not present

## 2015-06-06 NOTE — Progress Notes (Signed)
   Subjective:    Patient ID: Alison MurrayJames Plaia, male    DOB: 08/03/1960, 54 y.o.   MRN: [id number]  HPI Here for follow up for HIV.      On Tivicay and Truvada and has no missed doses.  Feels well.  No weight loss, no diarrhea. Seeing an endocrinologist for his DM.  Brought labs in today for review and last A1C was 6.2. Gets CD4 and viral load done here and all other labs done by PCP/endo.  No new issues.  [place] and planning to [place] a house and move there next year or so.    Review of Systems  Constitutional: Negative for fever and fatigue.  HENT: Negative for trouble swallowing.   Gastrointestinal: Negative for nausea and diarrhea.  Skin: Negative for rash.  Neurological: Negative for dizziness and light-headedness.  Hematological: Negative for adenopathy.       Objective:   Physical Exam  Constitutional: He appears well-developed and well-nourished. No distress.  HENT:  Some temporal wasting that is chronic  Eyes: No scleral icterus.  Cardiovascular: Normal rate, regular rhythm and normal heart sounds.   No murmur heard. Pulmonary/Chest: Effort normal and breath sounds normal. No respiratory distress. He has no wheezes.  Musculoskeletal: He exhibits no edema.  Lymphadenopathy:    He has no cervical adenopathy.  Skin: No rash noted.          Assessment & Plan:

## 2015-06-06 NOTE — Assessment & Plan Note (Signed)
Stable BP on his medication

## 2015-06-06 NOTE — Assessment & Plan Note (Signed)
Followed by his PCP 

## 2015-06-06 NOTE — Assessment & Plan Note (Signed)
Doing well on his medications.  I will change Truvada to Descovy.  RTC 6 months.

## 2015-06-07 ENCOUNTER — Encounter: Payer: Self-pay | Admitting: Family Medicine

## 2015-06-07 LAB — T-HELPER CELL (CD4) - (RCID CLINIC ONLY)
CD4 % Helper T Cell: 19 % — ABNORMAL LOW (ref 33–55)
CD4 T Cell Abs: 270 /uL — ABNORMAL LOW (ref 400–2700)

## 2015-06-07 LAB — HIV-1 RNA QUANT-NO REFLEX-BLD: HIV-1 RNA Quant, Log: <1.3 Log copies/mL (ref <1.30)

## 2015-06-08 ENCOUNTER — Encounter: Payer: Self-pay | Admitting: Internal Medicine

## 2015-06-08 ENCOUNTER — Other Ambulatory Visit: Payer: Self-pay | Admitting: *Deleted

## 2015-06-08 ENCOUNTER — Telehealth: Payer: Self-pay | Admitting: *Deleted

## 2015-06-08 DIAGNOSIS — B20 Human immunodeficiency virus [HIV] disease: Secondary | ICD-10-CM

## 2015-06-08 MED ORDER — EMTRICITABINE-TENOFOVIR AF 200-25 MG PO TABS: 1.0000 | ORAL_TABLET | Freq: Every day | ORAL | Status: DC

## 2015-06-08 NOTE — Telephone Encounter (Signed)
OK to switch from Truvada to Descovy? Additionally, patient states that he is interested in studies, should he qualify for any. Andree CossHowell, Trapper Meech M, RN

## 2015-06-08 NOTE — Telephone Encounter (Signed)
Yes to Descovy. We talked about it but I forgot to send it. thanks

## 2015-06-09 ENCOUNTER — Ambulatory Visit (INDEPENDENT_AMBULATORY_CARE_PROVIDER_SITE_OTHER): Payer: BLUE CROSS/BLUE SHIELD | Admitting: Family Medicine

## 2015-06-09 ENCOUNTER — Encounter: Payer: Self-pay | Admitting: Family Medicine

## 2015-06-09 VITALS — BP 124/80 | HR 84 | Ht 69.0 in | Wt 136.6 lb

## 2015-06-09 DIAGNOSIS — E1169 Type 2 diabetes mellitus with other specified complication: Secondary | ICD-10-CM

## 2015-06-09 DIAGNOSIS — E118 Type 2 diabetes mellitus with unspecified complications: Secondary | ICD-10-CM

## 2015-06-09 DIAGNOSIS — E785 Hyperlipidemia, unspecified: Secondary | ICD-10-CM

## 2015-06-09 DIAGNOSIS — Z7189 Other specified counseling: Secondary | ICD-10-CM | POA: Diagnosis not present

## 2015-06-09 DIAGNOSIS — I10 Essential (primary) hypertension: Secondary | ICD-10-CM

## 2015-06-09 DIAGNOSIS — Z7689 Persons encountering health services in other specified circumstances: Secondary | ICD-10-CM

## 2015-06-09 LAB — CBC WITH DIFFERENTIAL/PLATELET
BASOS PCT: 0 % (ref 0–1)
Basophils Absolute: 0 10*3/uL (ref 0.0–0.1)
EOS ABS: 0.1 10*3/uL (ref 0.0–0.7)
Eosinophils Relative: 1 % (ref 0–5)
HCT: 34.2 % — ABNORMAL LOW (ref 39.0–52.0)
HEMOGLOBIN: 12 g/dL — AB (ref 13.0–17.0)
Lymphocytes Relative: 9 % — ABNORMAL LOW (ref 12–46)
Lymphs Abs: 0.7 10*3/uL (ref 0.7–4.0)
MCH: 33.3 pg (ref 26.0–34.0)
MCHC: 35.1 g/dL (ref 30.0–36.0)
MCV: 95 fL (ref 78.0–100.0)
MONO ABS: 0.8 10*3/uL (ref 0.1–1.0)
MONOS PCT: 10 % (ref 3–12)
MPV: 10.2 fL (ref 8.6–12.4)
NEUTROS ABS: 6.2 10*3/uL (ref 1.7–7.7)
NEUTROS PCT: 80 % — AB (ref 43–77)
PLATELETS: 196 10*3/uL (ref 150–400)
RBC: 3.6 MIL/uL — AB (ref 4.22–5.81)
RDW: 13.2 % (ref 11.5–15.5)
WBC: 7.7 10*3/uL (ref 4.0–10.5)

## 2015-06-09 LAB — COMPREHENSIVE METABOLIC PANEL
ALBUMIN: 4.8 g/dL (ref 3.6–5.1)
ALT: 15 U/L (ref 9–46)
AST: 16 U/L (ref 10–35)
Alkaline Phosphatase: 42 U/L (ref 40–115)
BILIRUBIN TOTAL: 0.7 mg/dL (ref 0.2–1.2)
BUN: 28 mg/dL — AB (ref 7–25)
CHLORIDE: 106 mmol/L (ref 98–110)
CO2: 21 mmol/L (ref 20–31)
CREATININE: 2.04 mg/dL — AB (ref 0.70–1.33)
Calcium: 9.9 mg/dL (ref 8.6–10.3)
Glucose, Bld: 109 mg/dL — ABNORMAL HIGH (ref 65–99)
Potassium: 4 mmol/L (ref 3.5–5.3)
SODIUM: 138 mmol/L (ref 135–146)
Total Protein: 7.6 g/dL (ref 6.1–8.1)

## 2015-06-09 LAB — HEMOGLOBIN A1C
HEMOGLOBIN A1C: 5.8 % — AB (ref <5.7)
MEAN PLASMA GLUCOSE: 120 mg/dL — AB (ref <117)

## 2015-06-09 LAB — LIPID PANEL
Cholesterol: 193 mg/dL (ref 125–200)
HDL: 35 mg/dL — ABNORMAL LOW (ref >=40)
LDL CALC: 118 mg/dL (ref <130)
TRIGLYCERIDES: 202 mg/dL — AB (ref <150)
Total CHOL/HDL Ratio: 5.5 Ratio — ABNORMAL HIGH (ref <=5.0)
VLDL: 40 mg/dL — AB (ref <30)

## 2015-06-09 NOTE — Progress Notes (Signed)
Subjective:    Patient ID: Cameron Humphrey, male    DOB: June 18, 1961, 54 y.o.   MRN: [id number]  HPI Chief Complaint  Patient presents with  . new pt    new pt get est no other concerns. would like Kamylle Axelson to start taking over DM- last A1c was 6.7% does not wants records transferred over due to privacy. does not feel well.   He is new to the practice and here to establish primary care. States he has been going to [facility] previously and lives in [place]. He states he stopped going to [facility] because he felt that did not like the fact that all of his providers were able to see his medical information, he states he told one provider some very "embarrasing" information and they documented that information in his chart and then other providers were able to see that information. He states he will never share this medical information with anyone else and states "I know I will probably die from it but it's so embarrassing that I will never tell anyone else this information and I will not allow gets my medical records for you". He states he has copies of the information he wants this practice to know about and will send them to [place].  He has seen Dr. Leslie Dales at [facility] and states he missed his appointment in September and will not be able to go back to that practice. He also sees Dr. Luciana Axe for HIV and is aware that I have access to this information, he states he is ok with this. He states his HIV is under good control and he is undetectable.   States he would like for me to take over his Diabetes and primary care.  States he rarely checks his blood sugar because he "cannot stand needles".  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 Also reports having hyperlipidemia and hypertriglyceridemia.   States he is UTD on immunizations but no records present.    Lives with his husband since 1999. He does not work outside the home.    Denies  drinking, smoking, states he smokes marijuana daily, in order to stimulate his appetite, but does not want that information in his chart. States he feels like the government can get his information and he does not like that. Is worried about computer hacking. He states "I will break your knuckles if you put that in my chart".   He denies fever, chills, chest pain, cough or shortness of breath.   Reviewed allergies, medications, past medical, surgical, and social history.   Review of Systems Pertinent positives and negatives in the history of present illness.    Objective:   Physical Exam  Alert and oriented and in no acute distress. Not otherwise examined BP 124/80 mmHg  Pulse 84  Ht  (1.753 m)  Wt 136 lb 9.6 oz (61.961 kg)  BMI 20.16 kg/m2     Assessment & Plan:  Type 2 diabetes mellitus with complication, without long-term current use of insulin (HCC) - Plan: CBC with Differential/Platelet, Comprehensive metabolic panel, Lipid panel, Hemoglobin A1c  Essential hypertension, benign - Plan: CBC with Differential/Platelet, Comprehensive metabolic panel, Lipid panel  Hyperlipidemia associated with type 2 diabetes mellitus (HCC) - Plan: CBC with Differential/Platelet, Comprehensive metabolic panel, Lipid panel  Encounter to establish care  Discussed that he must avoid using threatening language such as "break your knuckles" when talking to me, that  I will not tolerate this from him and will not be able to provide medical care for him if he continues to speak in this manner. Also discussed that it will be difficult and challenging to care for him without him being forthcoming with his previous medical information. Discussed that I will document necessary information in his medical record and that this is confidential except for other providers who need the information to provide care for him. Will need to follow up on hyperlipidemia once lab results are available. Blood pressure is  normal today.  Discussed that he does not seem willing to check blood sugars at home and this will make it difficult to determine if his medication regimen is effective or not.

## 2015-06-12 ENCOUNTER — Encounter: Payer: Self-pay | Admitting: Internal Medicine

## 2015-06-12 ENCOUNTER — Encounter: Payer: Self-pay | Admitting: *Deleted

## 2015-06-14 ENCOUNTER — Encounter: Payer: Self-pay | Admitting: Family Medicine

## 2015-06-15 ENCOUNTER — Encounter: Payer: Self-pay | Admitting: Family Medicine

## 2015-06-18 ENCOUNTER — Encounter: Payer: Self-pay | Admitting: Family Medicine

## 2015-06-20 ENCOUNTER — Ambulatory Visit (INDEPENDENT_AMBULATORY_CARE_PROVIDER_SITE_OTHER): Payer: BLUE CROSS/BLUE SHIELD | Admitting: Family Medicine

## 2015-06-20 ENCOUNTER — Encounter: Payer: Self-pay | Admitting: Family Medicine

## 2015-06-20 VITALS — BP 118/72 | HR 64 | Wt 139.0 lb

## 2015-06-20 DIAGNOSIS — R748 Abnormal levels of other serum enzymes: Secondary | ICD-10-CM

## 2015-06-20 DIAGNOSIS — R7989 Other specified abnormal findings of blood chemistry: Secondary | ICD-10-CM

## 2015-06-20 DIAGNOSIS — D649 Anemia, unspecified: Secondary | ICD-10-CM

## 2015-06-20 LAB — CBC WITH DIFFERENTIAL/PLATELET
BASOS PCT: 0 % (ref 0–1)
Basophils Absolute: 0 10*3/uL (ref 0.0–0.1)
Eosinophils Absolute: 0.3 10*3/uL (ref 0.0–0.7)
Eosinophils Relative: 3 % (ref 0–5)
HEMATOCRIT: 31.5 % — AB (ref 39.0–52.0)
HEMOGLOBIN: 10.8 g/dL — AB (ref 13.0–17.0)
LYMPHS ABS: 1.8 10*3/uL (ref 0.7–4.0)
LYMPHS PCT: 18 % (ref 12–46)
MCH: 32.4 pg (ref 26.0–34.0)
MCHC: 34.3 g/dL (ref 30.0–36.0)
MCV: 94.6 fL (ref 78.0–100.0)
MONO ABS: 0.6 10*3/uL (ref 0.1–1.0)
MONOS PCT: 6 % (ref 3–12)
MPV: 9.5 fL (ref 8.6–12.4)
NEUTROS ABS: 7.4 10*3/uL (ref 1.7–7.7)
NEUTROS PCT: 73 % (ref 43–77)
Platelets: 366 10*3/uL (ref 150–400)
RBC: 3.33 MIL/uL — ABNORMAL LOW (ref 4.22–5.81)
RDW: 13.6 % (ref 11.5–15.5)
WBC: 10.2 10*3/uL (ref 4.0–10.5)

## 2015-06-20 LAB — POCT URINALYSIS DIPSTICK
Bilirubin, UA: NEGATIVE
Blood, UA: NEGATIVE
GLUCOSE UA: NEGATIVE
KETONES UA: NEGATIVE
LEUKOCYTES UA: NEGATIVE
Nitrite, UA: NEGATIVE
Urobilinogen, UA: NEGATIVE
pH, UA: 6

## 2015-06-20 LAB — BASIC METABOLIC PANEL WITH GFR
BUN: 49 mg/dL — AB (ref 7–25)
CO2: 23 mmol/L (ref 20–31)
Calcium: 9.5 mg/dL (ref 8.6–10.3)
Chloride: 102 mmol/L (ref 98–110)
Creat: 2.4 mg/dL — ABNORMAL HIGH (ref 0.70–1.33)
GFR, EST AFRICAN AMERICAN: 34 mL/min — AB (ref >=60)
GFR, EST NON AFRICAN AMERICAN: 30 mL/min — AB (ref >=60)
GLUCOSE: 81 mg/dL (ref 65–99)
POTASSIUM: 4.2 mmol/L (ref 3.5–5.3)
Sodium: 136 mmol/L (ref 135–146)

## 2015-06-20 NOTE — Progress Notes (Signed)
   Subjective:    Patient ID: Cameron MurrayJames Wachtel, male    DOB: 01/16/1961, 54 y.o.   MRN: [id number]  HPI Chief Complaint  Patient presents with  . consult    consult on DM and discuss lab results.   He is here for follow-up on lab results. His creatinine was elevated at his last appointment and suspected possible dehydration or lab error.  He is on several HIV medications and states one of his medications was recently switched  2 of more kidney friendly medication. He states he is feeling well, has been eating better and drinking more fluids.  Denies fever, chills, unexplained weight loss ,cough, abdominal pain, back pain, or urinary symptoms.    Discussed that he also had a slightly low hemoglobin 12.0, denies history of anemia.  Discussed that his lipid panel was not much different but his total cholesterol, LDL and Triglycerides were mildly higher.  He reports good compliance with his cholesterol medication. Questions eating a healthier diet to improve this.   He reports he has loose stool when eating certain foods such as fruit, fruit juice, and lettuce.  This is ongoing for several years and unchanged, denies blood in stool.  He thinks this is related to some of his medications States he has never had colonoscopy and does not want one.    reviewed allergies, medications.   Review of Systems  pertinent positives and negatives in the history of present illness.    Objective:   Physical Exam BP 118/72 mmHg  Pulse 64  Wt 139 lb (63.05 kg)  alert and oriented and in no acute distress.  Not otherwise examined.   urinalysis dipstick trace of protein, negative otherwise.     Assessment & Plan:  Elevated serum creatinine - Plan: BASIC METABOLIC PANEL WITH GFR, POCT urinalysis dipstick  Anemia, unspecified anemia type - Plan: CBC with Differential/Platelet   discussed that we will repeat his blood work today to check his kidney function and hemoglobin. If he continues to have elevated  kidney function will consider renal ultrasound and possible referral.  If he continues to have evidence of anemia , we discussed starting a multivitamin with iron. Discussed that I recommend a Colonoscopy however since he refuses to have one of these I recommend screening for colon cancer with Cologuard. Patient provided with Cologuard information and he will let me know if he decides to do this.

## 2015-06-21 ENCOUNTER — Encounter: Payer: Self-pay | Admitting: Internal Medicine

## 2015-06-21 ENCOUNTER — Encounter: Payer: Self-pay | Admitting: Family Medicine

## 2015-06-21 NOTE — Addendum Note (Signed)
Addended by: [name], [name] A on: 06/21/2015 10:34 AM   Modules accepted: Orders

## 2015-06-26 ENCOUNTER — Ambulatory Visit
Admission: RE | Admit: 2015-06-26 | Discharge: 2015-06-26 | Disposition: A | Discharge status: Home / Self Care | Payer: BLUE CROSS/BLUE SHIELD | Source: Ambulatory Visit | Attending: Family Medicine | Admitting: Family Medicine

## 2015-06-26 ENCOUNTER — Telehealth: Payer: Self-pay | Admitting: Internal Medicine

## 2015-06-26 DIAGNOSIS — R7989 Other specified abnormal findings of blood chemistry: Secondary | ICD-10-CM

## 2015-06-26 NOTE — Telephone Encounter (Signed)
Pt is scheduled with [facility] on 07/21/15 @ 12:00pm with Dr. Elvis CoilMartin Webb. [address] [place] [phone]

## 2015-06-28 ENCOUNTER — Ambulatory Visit (INDEPENDENT_AMBULATORY_CARE_PROVIDER_SITE_OTHER): Payer: BLUE CROSS/BLUE SHIELD | Admitting: Family Medicine

## 2015-06-28 ENCOUNTER — Encounter: Payer: Self-pay | Admitting: Family Medicine

## 2015-06-28 VITALS — BP 130/80 | HR 68 | Wt 140.0 lb

## 2015-06-28 DIAGNOSIS — I10 Essential (primary) hypertension: Secondary | ICD-10-CM

## 2015-06-28 DIAGNOSIS — R809 Proteinuria, unspecified: Secondary | ICD-10-CM | POA: Diagnosis not present

## 2015-06-28 DIAGNOSIS — R748 Abnormal levels of other serum enzymes: Secondary | ICD-10-CM | POA: Diagnosis not present

## 2015-06-28 DIAGNOSIS — D649 Anemia, unspecified: Secondary | ICD-10-CM

## 2015-06-28 DIAGNOSIS — N182 Chronic kidney disease, stage 2 (mild): Secondary | ICD-10-CM | POA: Insufficient documentation

## 2015-06-28 DIAGNOSIS — R7989 Other specified abnormal findings of blood chemistry: Secondary | ICD-10-CM

## 2015-06-28 DIAGNOSIS — R710 Precipitous drop in hematocrit: Secondary | ICD-10-CM | POA: Insufficient documentation

## 2015-06-28 DIAGNOSIS — Z8639 Personal history of other endocrine, nutritional and metabolic disease: Secondary | ICD-10-CM | POA: Diagnosis not present

## 2015-06-28 LAB — POCT URINALYSIS DIPSTICK
Bilirubin, UA: NEGATIVE
Blood, UA: NEGATIVE
Glucose, UA: NEGATIVE
Ketones, UA: NEGATIVE
LEUKOCYTES UA: NEGATIVE
Nitrite, UA: NEGATIVE
Spec Grav, UA: >=1.03
UROBILINOGEN UA: NEGATIVE
pH, UA: 6

## 2015-06-28 NOTE — Progress Notes (Signed)
   Subjective:    Patient ID: Alison MurrayJames Buesing, male    DOB: 12/08/1960, 54 y.o.   MRN: [id number]  HPI Chief Complaint  Patient presents with  . follow-up    follow-up. urine and labs.    He is here for follow up on elevated creatinine, dehydration, and anemia. At his last appointment his urinalysis showed trace of protein and a specific gravity of 1.030.   Metformin and Benazapril were both stopped approximately 7 days ago.  He had a renal ultrasound that was normal however, the renal arteries were not included in this study.  Hemoglobin was decreased at his previous visit Creatinine was elevated at previous visit Blood pressure- benazapril stopped on 06/21/15 due to elevated Cr Metformin stopped on 06/21/15 due to elevated Cr  He states he has been eating well. He states he has been urinating a lot   Review of Systems Review of Systems Constitutional: -fever, -chills, -sweats, -unexpected weight change,-+mild fatigue ENT: -runny nose, -ear pain, -sore throat Cardiology:  -chest pain, -palpitations, -edema Respiratory: -cough, -shortness of breath, -wheezing Gastroenterology: -abdominal pain, -nausea, -vomiting, -diarrhea, -constipation Hematology: -bleeding or bruising problems Musculoskeletal: -arthralgias, -myalgias, -joint swelling, -back pain  Ophthalmology: -vision changes Urology: -dysuria, -difficulty urinating, -hematuria, +urinary frequency, -urgency Neurology: -headache, -weakness, -tingling, -numbness      Objective:   Physical Exam BP 130/80 mmHg  Pulse 68  Wt 140 lb (63.504 kg)  Alert and oriented and in no distress.  Skin warm and dry, no rash or lesions. Tympanic membranes and canals are normal. Pharyngeal area is normal. Mucous membranes moist.  Neck is supple without adenopathy or thyromegaly.  No supraclavicular or axillary adenopathy.Cardiac exam shows a regular sinus rhythm without murmurs or gallops. Lungs are clear to auscultation. Abdomen soft, non  distended, normal appearance, normal bowel sounds, no bruits or mass, no ascites,  No hepatosplenomegaly.  No CVA tenderness.  No LE edema. CN II-IX intact, no tremor, DTRs normal and symmetrical.    urinalysis dipstick- trace of protein, specific gravity 1.030.       Assessment & Plan:  Elevated serum creatinine - Plan: BASIC METABOLIC PANEL WITH GFR  Hemoglobin decreased - Plan: CBC  Essential hypertension, benign  Proteinuria - Plan: POCT urinalysis dipstick  Discussed patient with Dr. Susann GivensLalonde. Discussed with patient that even though our recommendation was to come in to this visit well hydrated that he is not hydrated based on his UA. He admits that he has not hydrated and has only consumed 3 16 ounce bottles of water for past 2 days. He states he is able to drink but just doesn't like to. Labs were cancelled since his urine show that his hydration status is unchanged from last week. Patient is aware that he should double his current water intake and return next week for repeat labs and UA. He will remain off Metformin and Benazapril until further work up next week. Blood pressure continues to be within goal off of medication. If he returns and is still dehydrated will plan to refer to nephrology.

## 2015-07-03 ENCOUNTER — Encounter: Payer: Self-pay | Admitting: Family Medicine

## 2015-07-13 LAB — COLOGUARD: Cologuard: NEGATIVE

## 2015-07-14 ENCOUNTER — Encounter: Payer: Self-pay | Admitting: Internal Medicine

## 2015-07-28 ENCOUNTER — Encounter: Payer: Self-pay | Admitting: Family Medicine

## 2015-08-22 LAB — HM DIABETES EYE EXAM

## 2015-11-18 ENCOUNTER — Encounter: Payer: Self-pay | Admitting: Family Medicine

## 2015-11-20 ENCOUNTER — Telehealth: Payer: Self-pay | Admitting: Medical

## 2015-11-20 MED ORDER — FENOFIBRATE MICRONIZED 134 MG PO CAPS: 134.0000 mg | ORAL_CAPSULE | Freq: Every day | ORAL | Status: DC

## 2015-11-20 MED ORDER — ATORVASTATIN CALCIUM 40 MG PO TABS: 40.0000 mg | ORAL_TABLET | Freq: Every day | ORAL | Status: DC

## 2015-11-20 MED ORDER — GLIMEPIRIDE 2 MG PO TABS: 2.0000 mg | ORAL_TABLET | Freq: Every day | ORAL | Status: DC

## 2015-11-20 MED ORDER — SITAGLIPTIN PHOSPHATE 100 MG PO TABS: 100.0000 mg | ORAL_TABLET | Freq: Every day | ORAL | Status: DC

## 2015-11-20 MED ORDER — SERTRALINE HCL 100 MG PO TABS: 100.0000 mg | ORAL_TABLET | Freq: Every day | ORAL | Status: DC

## 2015-11-20 NOTE — Telephone Encounter (Signed)
Refill those medications for 30 days, make him f/u appt, can order CMET, CBC no diff, HgbA1C, Lipid.

## 2015-11-20 NOTE — Telephone Encounter (Signed)
Refilled meds and faxed over lab orders to [facility]

## 2015-11-21 MED ORDER — SERTRALINE HCL 100 MG PO TABS: 100.0000 mg | ORAL_TABLET | Freq: Every day | ORAL | Status: DC

## 2015-11-21 MED ORDER — ATORVASTATIN CALCIUM 40 MG PO TABS: 40.0000 mg | ORAL_TABLET | Freq: Every day | ORAL | Status: DC

## 2015-11-21 MED ORDER — GLIMEPIRIDE 2 MG PO TABS: 2.0000 mg | ORAL_TABLET | Freq: Every day | ORAL | Status: DC

## 2015-11-21 MED ORDER — FENOFIBRATE MICRONIZED 134 MG PO CAPS: 134.0000 mg | ORAL_CAPSULE | Freq: Every day | ORAL | Status: DC

## 2015-11-21 NOTE — Addendum Note (Signed)
Addended by: [name], SABRINA A on: 11/21/2015 09:35 AM   Modules accepted: Orders

## 2015-11-21 NOTE — Addendum Note (Signed)
Addended by: [name], SABRINA A on: 11/21/2015 04:14 PM   Modules accepted: Orders

## 2015-11-21 NOTE — Telephone Encounter (Signed)
Fax came back that sertraline and atorvastatin will only be covered in a 90 days

## 2015-11-21 NOTE — Addendum Note (Signed)
Addended by: [name], Levy Wellman A on: 11/21/2015 08:53 AM   Modules accepted: Orders

## 2015-11-26 ENCOUNTER — Other Ambulatory Visit: Payer: Self-pay | Admitting: Internal Medicine

## 2015-11-28 ENCOUNTER — Encounter: Payer: Self-pay | Admitting: Internal Medicine

## 2015-11-30 ENCOUNTER — Encounter: Payer: Self-pay | Admitting: Family Medicine

## 2015-12-05 ENCOUNTER — Encounter: Payer: Self-pay | Admitting: Internal Medicine

## 2015-12-05 ENCOUNTER — Ambulatory Visit (INDEPENDENT_AMBULATORY_CARE_PROVIDER_SITE_OTHER): Payer: BLUE CROSS/BLUE SHIELD | Admitting: Internal Medicine

## 2015-12-05 VITALS — BP 154/95 | HR 83 | Temp 98.4°F | Wt 139.0 lb

## 2015-12-05 DIAGNOSIS — N182 Chronic kidney disease, stage 2 (mild): Secondary | ICD-10-CM | POA: Diagnosis not present

## 2015-12-05 DIAGNOSIS — B20 Human immunodeficiency virus [HIV] disease: Secondary | ICD-10-CM

## 2015-12-05 NOTE — Assessment & Plan Note (Signed)
Doing great.  Labs today and rtc 6 months unless concerns.  

## 2015-12-05 NOTE — Progress Notes (Signed)
   Subjective:    Patient ID: Cameron Humphrey, male    DOB: 07/27/1960, 55 y.o.   MRN: [id number]  HPI Here for follow up for HIV.      On Tivicay and Descovy and has no missed doses.  Feels well.  No weight loss, no diarrhea. Seeing an endocrinologist for his DM.  Brought labs in today for review and last A1C was unfortunately 9.4, which is up for him. Gets CD4 and viral load done here and all other labs done by PCP/endo.  Creat is 1.46 and stable.  Peak was about 2.  Thinks it is the years of HIV medications.  On kidney-friendly regimen with [facility].  Recently caring for his father-in-law who fell and poor control of his diet accounting for his elevated A1C.     Review of Systems  Constitutional: Negative for fever and fatigue.  HENT: Negative for trouble swallowing.   Gastrointestinal: Negative for nausea and diarrhea.  Skin: Negative for rash.  Neurological: Negative for dizziness and light-headedness.  Hematological: Negative for adenopathy.       Objective:   Physical Exam  Constitutional: He appears well-developed and well-nourished. No distress.  HENT:  Some temporal wasting that is chronic  Eyes: No scleral icterus.  Cardiovascular: Normal rate, regular rhythm and normal heart sounds.   No murmur heard. Pulmonary/Chest: Effort normal and breath sounds normal. No respiratory distress. He has no wheezes.  Musculoskeletal: He exhibits no edema.  Lymphadenopathy:    He has no cervical adenopathy.  Skin: No rash noted.          Assessment & Plan:

## 2015-12-05 NOTE — Assessment & Plan Note (Signed)
Stable creat.  I discussed this is likely from HTN, DM and HIV/HIV meds.  Seems stable and has follow up with Dr. Hyman HopesWebb tomorrow.

## 2015-12-06 ENCOUNTER — Ambulatory Visit (INDEPENDENT_AMBULATORY_CARE_PROVIDER_SITE_OTHER): Payer: BLUE CROSS/BLUE SHIELD | Admitting: Medical

## 2015-12-06 ENCOUNTER — Encounter: Payer: Self-pay | Admitting: Family Medicine

## 2015-12-06 ENCOUNTER — Encounter: Payer: Self-pay | Admitting: Medical

## 2015-12-06 VITALS — BP 132/94 | HR 99 | Wt 139.0 lb

## 2015-12-06 DIAGNOSIS — I10 Essential (primary) hypertension: Secondary | ICD-10-CM | POA: Diagnosis not present

## 2015-12-06 DIAGNOSIS — E118 Type 2 diabetes mellitus with unspecified complications: Secondary | ICD-10-CM | POA: Diagnosis not present

## 2015-12-06 DIAGNOSIS — E785 Hyperlipidemia, unspecified: Secondary | ICD-10-CM | POA: Diagnosis not present

## 2015-12-06 DIAGNOSIS — N182 Chronic kidney disease, stage 2 (mild): Secondary | ICD-10-CM | POA: Diagnosis not present

## 2015-12-06 DIAGNOSIS — E1169 Type 2 diabetes mellitus with other specified complication: Secondary | ICD-10-CM | POA: Diagnosis not present

## 2015-12-06 DIAGNOSIS — B20 Human immunodeficiency virus [HIV] disease: Secondary | ICD-10-CM

## 2015-12-06 LAB — T-HELPER CELL (CD4) - (RCID CLINIC ONLY)
CD4 T CELL HELPER: 22 % — AB (ref 33–55)
CD4 T Cell Abs: 290 /uL — ABNORMAL LOW (ref 400–2700)

## 2015-12-06 LAB — HIV-1 RNA QUANT-NO REFLEX-BLD
HIV 1 RNA Quant: <20 copies/mL (ref <20)
HIV-1 RNA Quant, Log: <1.3 Log copies/mL (ref <1.30)

## 2015-12-06 MED ORDER — SITAGLIPTIN PHOSPHATE 100 MG PO TABS: 100.0000 mg | ORAL_TABLET | Freq: Every day | ORAL | Status: DC

## 2015-12-06 MED ORDER — SERTRALINE HCL 100 MG PO TABS: 100.0000 mg | ORAL_TABLET | Freq: Every day | ORAL | Status: DC

## 2015-12-06 MED ORDER — FENOFIBRATE MICRONIZED 134 MG PO CAPS: 134.0000 mg | ORAL_CAPSULE | Freq: Every day | ORAL | Status: DC

## 2015-12-06 MED ORDER — ATORVASTATIN CALCIUM 40 MG PO TABS: 40.0000 mg | ORAL_TABLET | Freq: Every day | ORAL | Status: DC

## 2015-12-06 MED ORDER — GLIMEPIRIDE 2 MG PO TABS: 2.0000 mg | ORAL_TABLET | Freq: Every day | ORAL | Status: DC

## 2015-12-06 MED ORDER — FERROUS SULFATE 325 (65 FE) MG PO TABS: 325.0000 mg | ORAL_TABLET | Freq: Every day | ORAL | Status: DC

## 2015-12-06 NOTE — Addendum Note (Signed)
Addended by: Jac CanavanYSINGER, DAVID S on: 12/06/2015 02:59 PM   Modules accepted: Orders

## 2015-12-06 NOTE — Progress Notes (Addendum)
Subjective: Chief Complaint  Patient presents with  . med check    states all is okay, but had to stay with father in law due to him falling and said a1c was elevated because of food that was availlable where he was   Here for med check.  Is a patient of [facility] here.   Needs refills on all non HIV medication.  Was seeing endocrinology prior.    Has hx/o diabetes - takes Glimepiride  daily, Januvia  once daily, was taken of metformin temporarily due to elevated creatinine.  Doesn't check glucose, but does have meter.  Exercises some.   Diet - is usually good, but since January he has been taking care of his father in law who has dementia.  During first quarter of the year wasn't eating the best given circumstances.  Diagnosed 7 years ago.  No other medications prior.  Has HIV, sees Dr. Staci Righter here with [facility].  Has hypertension diagnosis, not on medication currently but was on Benazepril prior.  Diagnosed 6 years ago  hyperlipemia - compliant with Lipitor  daily, [place]  daily  Takes iron ferous sulfate, just started this last visit.  Has hx/o pancreatitis years ago, had problem with side effect with HIV medication.  Sees kidney doctor today in follow up.   Wants info about diet.  Past Medical History  Diagnosis Date  . HIV infection (HCC)   . Hyperlipidemia   . Diabetes mellitus without complication (HCC)    ROS as in subjective  Objective: BP 132/94 mmHg  Pulse 99  Wt 139 lb (63.05 kg)  Gen: wd, wn, nad Psych: pleasant good eye contact, answers questions appropriately Long winded  Labs from 11/28/15 showed HgbA1C of 9.4%, creatinine of 1.46, glucose 286.  Blood counts ok. HDL low.    Assessment: Encounter Diagnoses  Name Primary?  . Diabetes mellitus with complication (HCC) Yes  . Hyperlipidemia associated with type 2 diabetes mellitus (HCC)   . Human immunodeficiency virus (HIV) disease (HCC)   . Essential hypertension,  benign   . Chronic kidney disease (CKD) stage G2/A1, mildly decreased glomerular filtration rate (GFR) between 60-89 mL/min/1.73 square meter and albuminuria creatinine ratio less than 30 mg/g      Plan: He sees nephrology today in f/u.  I reviewed his 07/2015 visit notes.  Reviewed his recent labs.  HgbA1C, HDL and trig not at goal  Hypertension - will defer medication choice to nephrology whom he sees today  hyperlipidemia - c/t Lipitor and [place] for now.  Reassess in 34mo  CKD - f/u with nephrology today.  Gave copy of labs to take to nephrology today.  Diabetes - Discussed diet in great detail, discussed exercise.   He will c/t Glimepiride and Januvia.   Declines medication changes for now.  He feels strongly he can get diet under control and improve numbers over the next few months.  Of note he saw nutritionist within past year. HIV managed by infectious disease  See nephrology today, f/u here in 34mo fasting  Spent > 30 minutes face to face with patient in discussion of symptoms, evaluation, plan and recommendations.     Cameron Humphrey was seen today for med check.  Diagnoses and all orders for this visit:  Diabetes mellitus with complication (HCC)  Hyperlipidemia associated with type 2 diabetes mellitus (HCC)  Human immunodeficiency virus (HIV) disease (HCC)  Essential hypertension, benign  Chronic kidney disease (CKD) stage G2/A1, mildly decreased glomerular filtration rate (GFR) between 60-89  mL/min/1.73 square meter and albuminuria creatinine ratio less than 30 mg/g  Other orders -     sitaGLIPtin (JANUVIA) 100 MG tablet; Take 1 tablet (100 mg total) by mouth daily. -     sertraline (ZOLOFT) 100 MG tablet; Take 1 tablet (100 mg total) by mouth daily. -     fenofibrate micronized (LOFIBRA) 134 MG capsule; Take 1 capsule (134 mg total) by mouth daily before breakfast. -     glimepiride (AMARYL) 2 MG tablet; Take 1 tablet (2 mg total) by mouth daily with breakfast. -      ferrous sulfate 325 (65 FE) MG tablet; Take 1 tablet (325 mg total) by mouth daily with breakfast. -     atorvastatin (LIPITOR) 40 MG tablet; Take 1 tablet (40 mg total) by mouth daily.

## 2015-12-07 ENCOUNTER — Other Ambulatory Visit: Payer: Self-pay | Admitting: Medical

## 2015-12-07 MED ORDER — BENAZEPRIL HCL 10 MG PO TABS: 10.0000 mg | ORAL_TABLET | Freq: Every day | ORAL | Status: DC

## 2016-01-26 ENCOUNTER — Encounter: Payer: Self-pay | Admitting: Family Medicine

## 2016-02-28 ENCOUNTER — Encounter: Payer: Self-pay | Admitting: Family Medicine

## 2016-03-06 ENCOUNTER — Telehealth: Payer: Self-pay

## 2016-03-06 ENCOUNTER — Ambulatory Visit (INDEPENDENT_AMBULATORY_CARE_PROVIDER_SITE_OTHER): Payer: BLUE CROSS/BLUE SHIELD | Admitting: Family Medicine

## 2016-03-06 ENCOUNTER — Encounter: Payer: Self-pay | Admitting: Family Medicine

## 2016-03-06 VITALS — BP 120/80 | HR 67 | Wt 144.8 lb

## 2016-03-06 DIAGNOSIS — I10 Essential (primary) hypertension: Secondary | ICD-10-CM | POA: Diagnosis not present

## 2016-03-06 DIAGNOSIS — E1169 Type 2 diabetes mellitus with other specified complication: Secondary | ICD-10-CM

## 2016-03-06 DIAGNOSIS — E785 Hyperlipidemia, unspecified: Secondary | ICD-10-CM | POA: Diagnosis not present

## 2016-03-06 DIAGNOSIS — E118 Type 2 diabetes mellitus with unspecified complications: Secondary | ICD-10-CM | POA: Diagnosis not present

## 2016-03-06 DIAGNOSIS — N182 Chronic kidney disease, stage 2 (mild): Secondary | ICD-10-CM

## 2016-03-06 LAB — LIPID PANEL
CHOLESTEROL: 150 mg/dL (ref 125–200)
HDL: 25 mg/dL — ABNORMAL LOW (ref >=40)
LDL CALC: 69 mg/dL (ref <130)
Total CHOL/HDL Ratio: 6 Ratio — ABNORMAL HIGH (ref <=5.0)
Triglycerides: 282 mg/dL — ABNORMAL HIGH (ref <150)
VLDL: 56 mg/dL — AB (ref <30)

## 2016-03-06 LAB — HEMOGLOBIN A1C
HEMOGLOBIN A1C: 6.6 % — AB (ref <5.7)
Mean Plasma Glucose: 143 mg/dL

## 2016-03-06 NOTE — Telephone Encounter (Signed)
I do. 

## 2016-03-06 NOTE — Progress Notes (Signed)
   Subjective:    Patient ID: Cameron MurrayJames Humphrey, male    DOB: 12/28/1960, 55 y.o.   MRN: [id number]  HPI Chief Complaint  Patient presents with  . Other    follow-up    He is here for follow up on Diabetes, HTN and CKD  His A1C was 9.4% 3 months ago. States he was eating poorly for several weeks during that timeframe but has since improved his diet. He does not check his blood sugar at home. He is taking cinnamon, some sort of mixture of honey and stevia, and cider vinegar at home. States he is trying home remedies to control blood pressure and cholesterol. He is taking daily Glimepiride and Januvia and reports good compliance.   States blood pressures at home have been normal. States he does have a mild cough and thinks it's related to his blood pressure medication. States it is not bothersome so he does not want to do anything about it.  Denies fever, chills, headache, dizziness, chest pain, palpitations, shortness of breath, GI or GU symptoms. Denies any issues with vision or feet. Recently had some labs done at [facility], a BMP.   Reviewed allergies, medications, past medical, and social history.   Review of Systems Pertinent positives and negatives in the history of present illness.     Objective:   Physical Exam  Constitutional: He is oriented to person, place, and time. He appears well-developed and well-nourished. No distress.  Neck: Normal range of motion. Neck supple.  Cardiovascular: Normal rate, regular rhythm, normal heart sounds and intact distal pulses.   Pulmonary/Chest: Effort normal and breath sounds normal.  Musculoskeletal:       Right foot: Normal.       Left foot: Normal.  Neurological: He is alert and oriented to person, place, and time.  Skin: Skin is warm, dry and intact. No rash noted. No pallor.  Psychiatric: He has a normal mood and affect. His behavior is normal. Judgment and thought content normal.   BP 120/80   Pulse 67   Wt 144 lb 12.8 oz (65.7 kg)    BMI 21.38 kg/m       Assessment & Plan:  Diabetes mellitus with complication (HCC)  Chronic kidney disease (CKD) stage G2/A1, mildly decreased glomerular filtration rate (GFR) between 60-89 mL/min/1.73 square meter and albuminuria creatinine ratio less than 30 mg/g  Hyperlipidemia associated with type 2 diabetes mellitus (HCC)  Essential hypertension, benign  Diabetic foot exam performed and normal. Microalbumin test performed.  He reports having a recent eye exam and will get this documentation for us.  Reviewed BMP from 03/01/2016 and his creatinine is stable at 1.39. Labs sent to be scanned.  Continue seeing Dr. Luciana Axeomer for HIV and Dr. Hyman HopesWebb for CKD.  Discussed his blood pressure is within normal range and recommend he continue on current medication. Continue on current medication regimen for diabetes and may need to adjust this pending A1C and labs.  Fasting lipids ordered. Continue on current medication, atorvastain and fenofibrate for now.  Follow-up in 3-4 months for diabetes and hyperlipidemia.

## 2016-03-06 NOTE — Patient Instructions (Signed)
Continue on current medications. We will call you with lab results.  Basic Carbohydrate Counting for Diabetes Mellitus Carbohydrate counting is a method for keeping track of the amount of carbohydrates you eat. Eating carbohydrates naturally increases the level of sugar (glucose) in your blood, so it is important for you to know the amount that is okay for you to have in every meal. Carbohydrate counting helps keep the level of glucose in your blood within normal limits. The amount of carbohydrates allowed is different for every person. A dietitian can help you calculate the amount that is right for you. Once you know the amount of carbohydrates you can have, you can count the carbohydrates in the foods you want to eat. Carbohydrates are found in the following foods:  Grains, such as breads and cereals.  Dried beans and soy products.  Starchy vegetables, such as potatoes, peas, and corn.  Fruit and fruit juices.  Milk and yogurt.  Sweets and snack foods, such as cake, cookies, candy, chips, soft drinks, and fruit drinks. CARBOHYDRATE COUNTING There are two ways to count the carbohydrates in your food. You can use either of the methods or a combination of both. Reading the "Nutrition Facts" on Packaged Food The "Nutrition Facts" is an area that is included on the labels of almost all packaged food and beverages in the [place]. It includes the serving size of that food or beverage and information about the nutrients in each serving of the food, including the grams (g) of carbohydrate per serving.  Decide the number of servings of this food or beverage that you will be able to eat or drink. Multiply that number of servings by the number of grams of carbohydrate that is listed on the label for that serving. The total will be the amount of carbohydrates you will be having when you eat or drink this food or beverage. Learning Standard Serving Sizes of Food When you eat food that is not  packaged or does not include "Nutrition Facts" on the label, you need to measure the servings in order to count the amount of carbohydrates.A serving of most carbohydrate-rich foods contains about 15 g of carbohydrates. The following list includes serving sizes of carbohydrate-rich foods that provide 15 g ofcarbohydrate per serving:   1 slice of bread (1 oz) or 1 six-inch tortilla.    of a hamburger bun or English muffin.  4-6 crackers.   cup unsweetened dry cereal.    cup hot cereal.   cup rice or pasta.    cup mashed potatoes or  of a large baked potato.  1 cup fresh fruit or one small piece of fruit.    cup canned or frozen fruit or fruit juice.  1 cup milk.   cup plain fat-free yogurt or yogurt sweetened with artificial sweeteners.   cup cooked dried beans or starchy vegetable, such as peas, corn, or potatoes.  Decide the number of standard-size servings that you will eat. Multiply that number of servings by 15 (the grams of carbohydrates in that serving). For example, if you eat 2 cups of strawberries, you will have eaten 2 servings and 30 g of carbohydrates (2 servings x 15 g = 30 g). For foods such as soups and casseroles, in which more than one food is mixed in, you will need to count the carbohydrates in each food that is included. EXAMPLE OF CARBOHYDRATE COUNTING Sample Dinner  3 oz chicken breast.   cup of brown rice.   cup  of corn.  1 cup milk.   1 cup strawberries with sugar-free whipped topping.  Carbohydrate Calculation Step 1: Identify the foods that contain carbohydrates:   Rice.   Corn.   Milk.   Strawberries. Step 2:Calculate the number of servings eaten of each:   2 servings of rice.   1 serving of corn.   1 serving of milk.   1 serving of strawberries. Step 3: Multiply each of those number of servings by 15 g:   2 servings of rice x 15 g = 30 g.   1 serving of corn x 15 g = 15 g.   1 serving of milk x 15  g = 15 g.   1 serving of strawberries x 15 g = 15 g. Step 4: Add together all of the amounts to find the total grams of carbohydrates eaten: 30 g + 15 g + 15 g + 15 g = 75 g.   This information is not intended to replace advice given to you by your health care provider. Make sure you discuss any questions you have with your health care provider.   Document Released: 06/24/2005 Document Revised: 07/15/2014 Document Reviewed: 05/21/2013 Elsevier Interactive Patient Education [employer].

## 2016-03-06 NOTE — Telephone Encounter (Signed)
Pt calling to see if you have labs on him from [facility]?

## 2016-03-07 ENCOUNTER — Encounter: Payer: Self-pay | Admitting: Family Medicine

## 2016-03-07 LAB — MICROALBUMIN / CREATININE URINE RATIO
Creatinine, Urine: 65 mg/dL (ref 20–370)
MICROALB UR: 0.6 mg/dL
MICROALB/CREAT RATIO: 9 ug/mg{creat} (ref <30)

## 2016-04-24 ENCOUNTER — Other Ambulatory Visit: Payer: Self-pay | Admitting: Medical

## 2016-04-29 ENCOUNTER — Encounter: Payer: Self-pay | Admitting: Family Medicine

## 2016-05-20 ENCOUNTER — Other Ambulatory Visit: Payer: Self-pay | Admitting: Internal Medicine

## 2016-05-29 ENCOUNTER — Other Ambulatory Visit: Payer: Self-pay | Admitting: Medical

## 2016-06-06 ENCOUNTER — Ambulatory Visit (INDEPENDENT_AMBULATORY_CARE_PROVIDER_SITE_OTHER): Payer: BLUE CROSS/BLUE SHIELD | Admitting: Internal Medicine

## 2016-06-06 ENCOUNTER — Encounter: Payer: Self-pay | Admitting: Internal Medicine

## 2016-06-06 VITALS — BP 133/76 | HR 69 | Temp 98.1°F | Wt 144.0 lb

## 2016-06-06 DIAGNOSIS — N182 Chronic kidney disease, stage 2 (mild): Secondary | ICD-10-CM | POA: Diagnosis not present

## 2016-06-06 DIAGNOSIS — B20 Human immunodeficiency virus [HIV] disease: Secondary | ICD-10-CM

## 2016-06-06 NOTE — Progress Notes (Signed)
   Subjective:    Patient ID: Cameron MurrayJames Bates, male    DOB: 08/08/1960, 55 y.o.   MRN: [id number]  HPI Here for follow up for HIV.      On Tivicay and Descovy and has no missed doses.  Feels well.  No weight loss, no diarrhea. Seeing an endocrinologist for his DM.  Brought labs in today for review and last A1C was unfortunately 9.4, which is up for him. Gets CD4 and viral load done here and all other labs done by PCP/endo.  Creat is 1.46 and stable.  Peak was about 2.  Thinks it is the years of HIV medications.  On kidney-friendly regimen with [facility].   [place] and he is hopeful to move in the Spring.   Review of Systems  Constitutional: Negative for fatigue and fever.  HENT: Negative for trouble swallowing.   Gastrointestinal: Negative for diarrhea and nausea.  Skin: Negative for rash.  Neurological: Negative for dizziness and light-headedness.  Hematological: Negative for adenopathy.       Objective:   Physical Exam  Constitutional: He appears well-developed and well-nourished. No distress.  HENT:  Some temporal wasting that is chronic  Eyes: No scleral icterus.  Cardiovascular: Normal rate, regular rhythm and normal heart sounds.   No murmur heard. Pulmonary/Chest: Effort normal and breath sounds normal. No respiratory distress. He has no wheezes.  Musculoskeletal: He exhibits no edema.  Lymphadenopathy:    He has no cervical adenopathy.  Skin: No rash noted.          Assessment & Plan:

## 2016-06-06 NOTE — Assessment & Plan Note (Signed)
Doing great.  Labs today and rtc 6 months unless he has moved.

## 2016-06-06 NOTE — Assessment & Plan Note (Signed)
Stable. No dose adjustment needed at current creat.

## 2016-06-07 ENCOUNTER — Ambulatory Visit (INDEPENDENT_AMBULATORY_CARE_PROVIDER_SITE_OTHER): Payer: BLUE CROSS/BLUE SHIELD | Admitting: Family Medicine

## 2016-06-07 ENCOUNTER — Encounter: Payer: Self-pay | Admitting: Family Medicine

## 2016-06-07 VITALS — BP 130/70 | HR 63 | Resp 16 | Wt 145.8 lb

## 2016-06-07 DIAGNOSIS — I10 Essential (primary) hypertension: Secondary | ICD-10-CM | POA: Diagnosis not present

## 2016-06-07 DIAGNOSIS — E118 Type 2 diabetes mellitus with unspecified complications: Secondary | ICD-10-CM

## 2016-06-07 DIAGNOSIS — N182 Chronic kidney disease, stage 2 (mild): Secondary | ICD-10-CM

## 2016-06-07 DIAGNOSIS — E782 Mixed hyperlipidemia: Secondary | ICD-10-CM | POA: Diagnosis not present

## 2016-06-07 LAB — CBC WITH DIFFERENTIAL/PLATELET
BASOS ABS: 0 {cells}/uL (ref 0–200)
BASOS PCT: 0 %
EOS PCT: 5 %
Eosinophils Absolute: 225 cells/uL (ref 15–500)
HCT: 38.2 % — ABNORMAL LOW (ref 38.5–50.0)
HEMOGLOBIN: 12.8 g/dL — AB (ref 13.2–17.1)
LYMPHS ABS: 1395 {cells}/uL (ref 850–3900)
Lymphocytes Relative: 31 %
MCH: 31.4 pg (ref 27.0–33.0)
MCHC: 33.5 g/dL (ref 32.0–36.0)
MCV: 93.6 fL (ref 80.0–100.0)
MPV: 11.3 fL (ref 7.5–12.5)
Monocytes Absolute: 495 cells/uL (ref 200–950)
Monocytes Relative: 11 %
NEUTROS ABS: 2385 {cells}/uL (ref 1500–7800)
Neutrophils Relative %: 53 %
Platelets: 184 10*3/uL (ref 140–400)
RBC: 4.08 MIL/uL — AB (ref 4.20–5.80)
RDW: 12.9 % (ref 11.0–15.0)
WBC: 4.5 10*3/uL (ref 4.0–10.5)

## 2016-06-07 LAB — COMPLETE METABOLIC PANEL WITH GFR
ALBUMIN: 4.8 g/dL (ref 3.6–5.1)
ALK PHOS: 29 U/L — AB (ref 40–115)
ALT: 17 U/L (ref 9–46)
AST: 20 U/L (ref 10–35)
BILIRUBIN TOTAL: 0.6 mg/dL (ref 0.2–1.2)
BUN: 32 mg/dL — AB (ref 7–25)
CO2: 25 mmol/L (ref 20–31)
CREATININE: 1.51 mg/dL — AB (ref 0.70–1.33)
Calcium: 9.9 mg/dL (ref 8.6–10.3)
Chloride: 105 mmol/L (ref 98–110)
GFR, Est African American: 60 mL/min (ref >=60)
GFR, Est Non African American: 52 mL/min — ABNORMAL LOW (ref >=60)
GLUCOSE: 101 mg/dL — AB (ref 65–99)
Potassium: 4.3 mmol/L (ref 3.5–5.3)
SODIUM: 138 mmol/L (ref 135–146)
TOTAL PROTEIN: 7.5 g/dL (ref 6.1–8.1)

## 2016-06-07 LAB — TSH: TSH: 2.05 mIU/L (ref 0.40–4.50)

## 2016-06-07 LAB — LIPID PANEL
Cholesterol: 139 mg/dL (ref <200)
HDL: 24 mg/dL — ABNORMAL LOW (ref >40)
LDL CALC: 56 mg/dL (ref <100)
Total CHOL/HDL Ratio: 5.8 Ratio — ABNORMAL HIGH (ref <5.0)
Triglycerides: 293 mg/dL — ABNORMAL HIGH (ref <150)
VLDL: 59 mg/dL — AB (ref <30)

## 2016-06-07 LAB — POCT GLYCOSYLATED HEMOGLOBIN (HGB A1C): HEMOGLOBIN A1C: 6.4

## 2016-06-07 LAB — T-HELPER CELL (CD4) - (RCID CLINIC ONLY)
CD4 % Helper T Cell: 21 % — ABNORMAL LOW (ref 33–55)
CD4 T Cell Abs: 350 /uL — ABNORMAL LOW (ref 400–2700)

## 2016-06-07 NOTE — Progress Notes (Deleted)
   Subjective:    Patient ID: Cameron MurrayJames Luckey, male    DOB: 02/26/1961, 55 y.o.   MRN: [id number]  HPI    Review of Systems     Objective:   Physical Exam        Assessment & Plan:

## 2016-06-07 NOTE — Progress Notes (Signed)
Subjective:    Patient ID: Cameron Humphrey Azam, male    DOB: 01/17/1961, 55 y.o.   MRN: [id number]  Cameron Humphrey Shappell is a 55 y.o. male who presents for follow-up of Type 2 diabetes mellitus. No concerns or complaints today.   Other providers: [facility] -Dr. Hyman HopesWebb. States he has not been there in months and states he was told everything was stable. He was called to schedule a f/u appt and states he does not think he needs to go back anytime soon.  He cut out caffeine. Is eating a healthy diet.   Patient is not checking home blood sugars.   Home blood sugar records: none How often is blood sugars being checked: none Current symptoms include: polyuria. Patient denies other sx  Patient is checking their feet daily. Any Foot concerns (callous, ulcer, wound, thickened nails, toenail fungus, skin fungus, hammer toe): none Last dilated eye exam: Feb 2017  Current treatments: medication and low carb and cholesterol diet. Medication compliance: good  Current diet: in general, a "healthy" diet   Current exercise: none Known diabetic complications: nephropathy  The following portions of the patient's history were reviewed and updated as appropriate: allergies, current medications, past medical history, past social history and problem list.  ROS as in subjective above.     Objective:    Physical Exam Alert and in no distress otherwise not examined.  Blood pressure 130/70, pulse 63, resp. rate 16, weight 145 lb 12.8 oz (66.1 kg), SpO2 98 %.  Lab Review Diabetic Labs Latest Ref Rng & Units 06/07/2016 03/06/2016 06/20/2015 06/09/2015 03/31/2014  HbA1c - 6.4 6.6(H) - 5.8(H) -  Microalbumin Not estab mg/dL - 0.6 - - -  Micro/Creat Ratio <30 mcg/mg creat - 9 - - -  Chol 125 - 200 mg/dL - 409150 - 811193 -  HDL >=91>=40 mg/dL - 47(W25(L) - 29(F35(L) -  Calc LDL <130 mg/dL - 69 - 621118 -  Triglycerides <150 mg/dL - 308(M282(H) - 578(I202(H) -  Creatinine 0.70 - 1.33 mg/dL - - 6.96(E2.40(H) 9.52(W2.04(H) 4.130.97   BP/Weight 06/07/2016 06/06/2016  03/06/2016 12/06/2015 12/05/2015  Systolic BP 130 133 120 132 154  Diastolic BP 70 76 80 94 95  Wt. (Lbs) 145.8 144 144.8 139 139  BMI 21.53 21.27 21.38 20.52 20.52   Foot/eye exam completion dates Latest Ref Rng & Units 03/06/2016 08/22/2015  Eye Exam No Retinopathy - No Retinopathy  Foot Form Completion - Done -    Fayrene FearingJames  reports that he has never smoked. He has never used smokeless tobacco. He reports that he does not drink alcohol or use drugs.     Assessment & Plan:    Diabetes mellitus with complication (HCC) - Plan: HgB A1c, CBC with Differential/Platelet, COMPLETE METABOLIC PANEL WITH GFR, TSH  Essential hypertension, benign  Chronic kidney disease (CKD) stage G2/A1, mildly decreased glomerular filtration rate (GFR) between 60-89 mL/min/1.73 square meter and albuminuria creatinine ratio less than 30 mg/g - Plan: COMPLETE METABOLIC PANEL WITH GFR  Mixed hyperlipidemia - Plan: Lipid panel  1. Rx changes: none A1C is 6.4% and within goal.  2. Recheck of lipids and may need to alter medication if not improving.  3. Blood pressure is within goal. Continue on current medication.  4. Education: Reviewed 'ABCs' of diabetes management (respective goals in parentheses):  A1C (<7), blood pressure (<130/80), and cholesterol (LDL <100). 5. Compliance at present is estimated to be good. Efforts to improve compliance (if necessary) will be directed at dietary modifications: low sugar, carb  and fat and increased exercise. 6. Follow up: 3 months

## 2016-06-10 LAB — HIV-1 RNA QUANT-NO REFLEX-BLD

## 2016-06-16 ENCOUNTER — Other Ambulatory Visit: Payer: Self-pay | Admitting: Internal Medicine

## 2016-06-16 DIAGNOSIS — B20 Human immunodeficiency virus [HIV] disease: Secondary | ICD-10-CM

## 2016-07-02 ENCOUNTER — Other Ambulatory Visit: Payer: Self-pay | Admitting: Medical

## 2016-08-05 ENCOUNTER — Encounter: Payer: Self-pay | Admitting: Family Medicine

## 2016-08-14 ENCOUNTER — Other Ambulatory Visit: Payer: Self-pay | Admitting: Family Medicine

## 2016-08-14 ENCOUNTER — Other Ambulatory Visit: Payer: Self-pay | Admitting: Medical

## 2016-08-25 ENCOUNTER — Other Ambulatory Visit: Payer: Self-pay | Admitting: Medical

## 2016-09-15 ENCOUNTER — Other Ambulatory Visit: Payer: Self-pay | Admitting: Medical

## 2016-09-16 NOTE — Telephone Encounter (Signed)
Can this patient have a refill on this ? 

## 2016-09-17 MED ORDER — SERTRALINE HCL 100 MG PO TABS: 100.0000 mg | ORAL_TABLET | Freq: Every day | ORAL | 1 refills | Status: DC

## 2016-09-17 NOTE — Telephone Encounter (Signed)
done

## 2016-09-17 NOTE — Telephone Encounter (Signed)
Is this okay to refill? 

## 2016-09-17 NOTE — Telephone Encounter (Signed)
Ok to refill 

## 2016-09-17 NOTE — Addendum Note (Signed)
Addended by: [name], Quavion Boule A on: 09/17/2016 09:09 AM   Modules accepted: Orders

## 2016-09-21 IMAGING — US US RENAL
1 series · 14 of 25 positions shown · non-contrast
Comparison: None in PACs

CLINICAL DATA: Elevated serum creatinine, history of hypertension

EXAM:
RENAL / URINARY TRACT ULTRASOUND COMPLETE

[Series 1: us renal · 0.26mm/px · 14 of 26 slices shown]
[im 1/26]
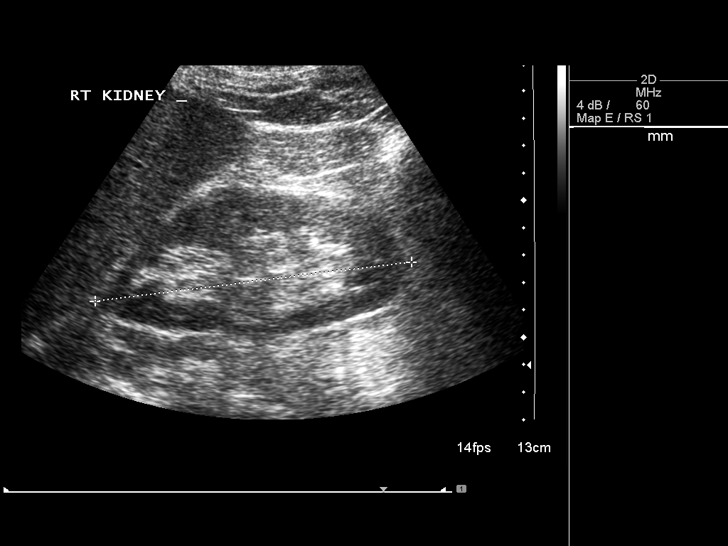
[im 3/26]
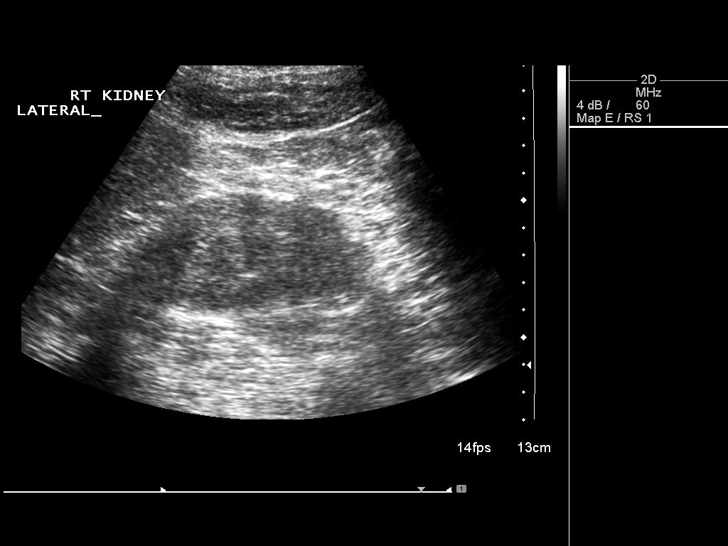
[im 5/26]
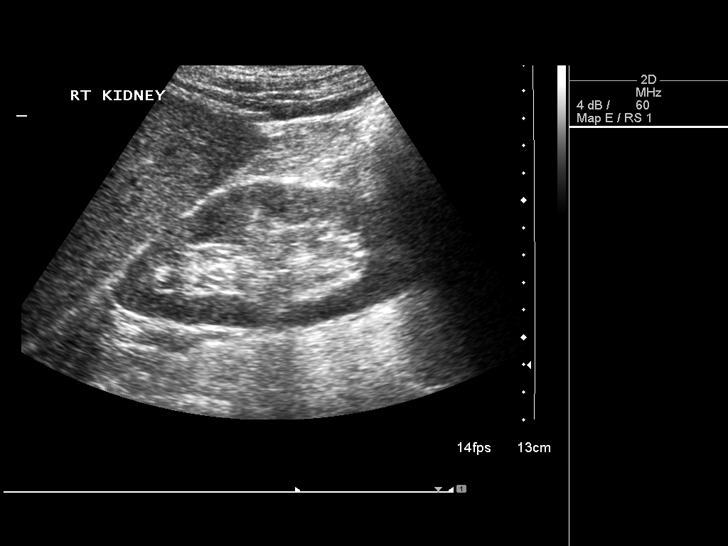
[im 7/26]
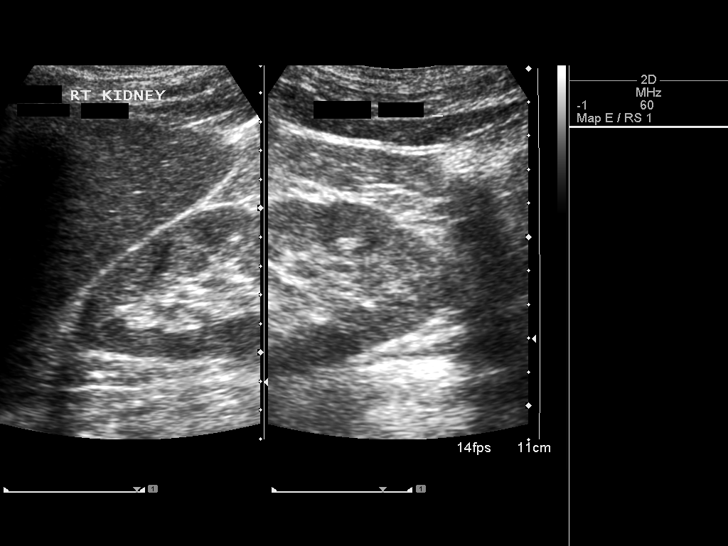
[im 9/26]
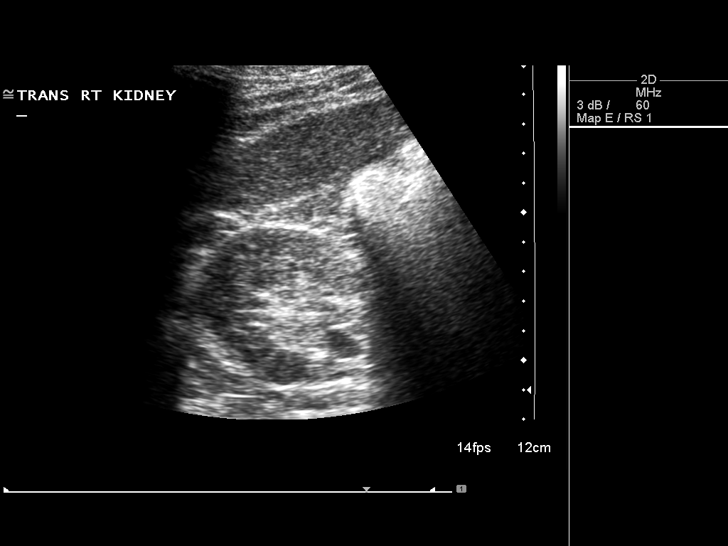
[im 10/26]
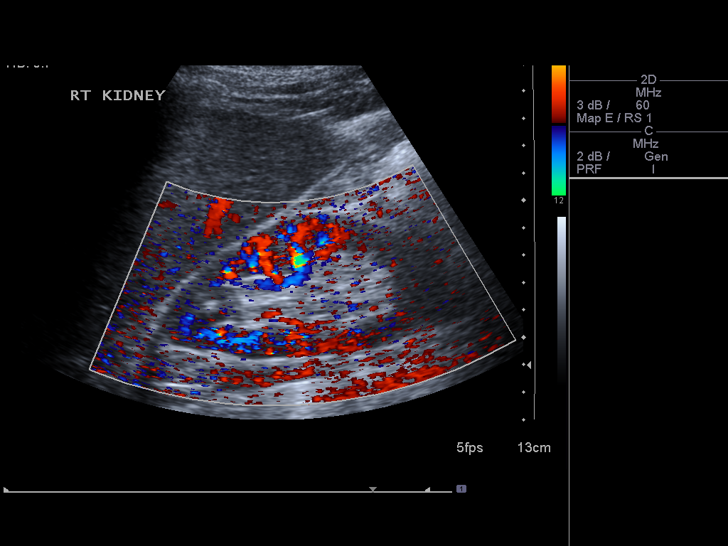
[im 12/26]
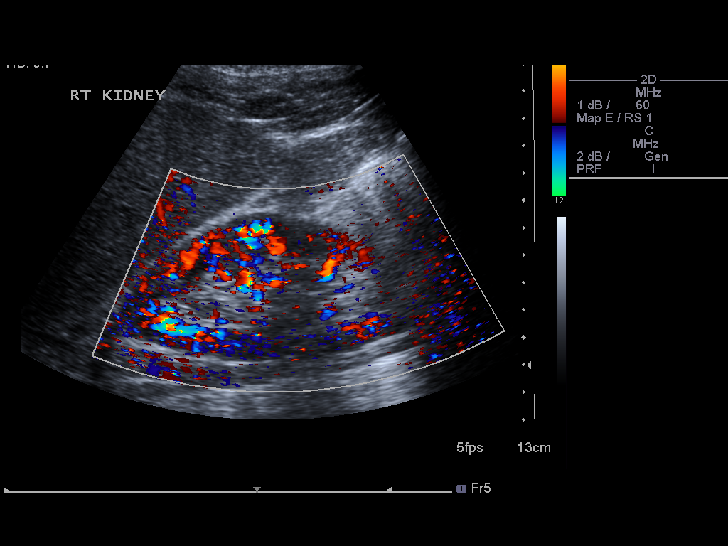
[im 14/26]
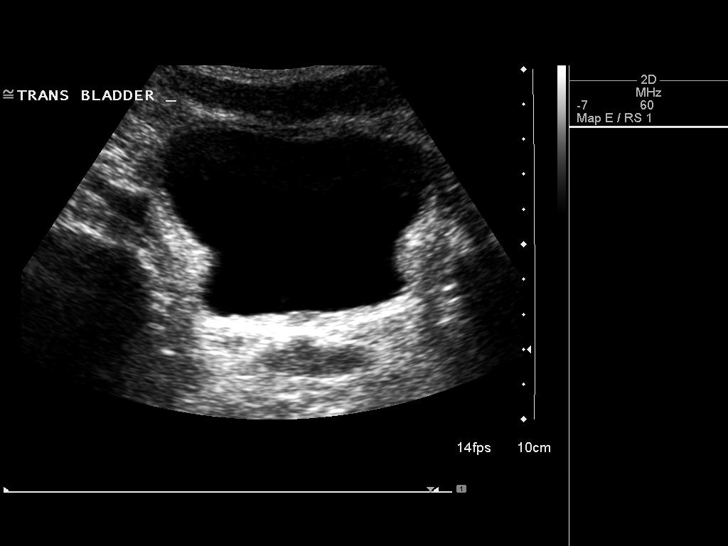
[im 16/26]
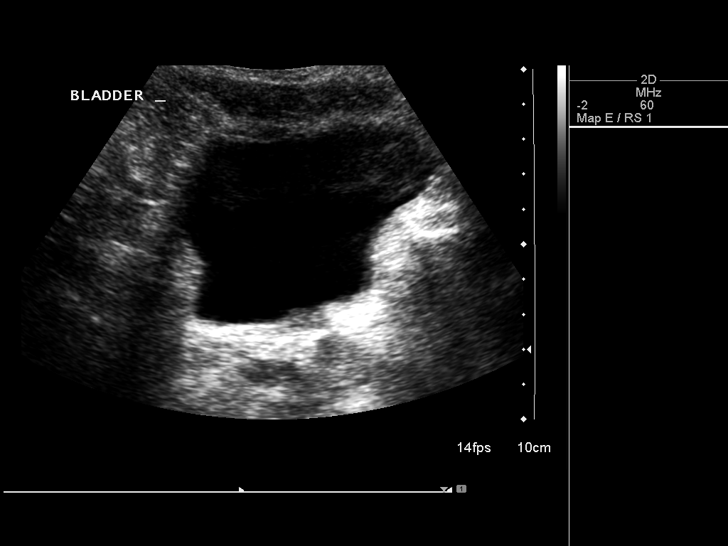
[im 17/26]
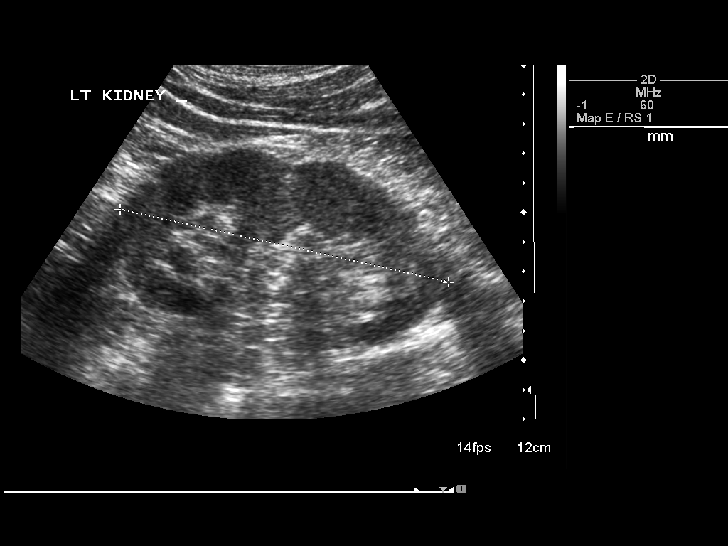
[im 19/26]
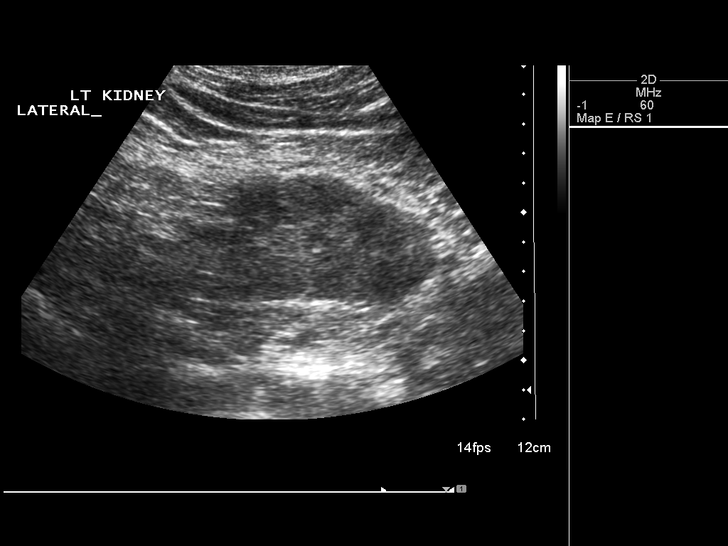
[im 21/26]
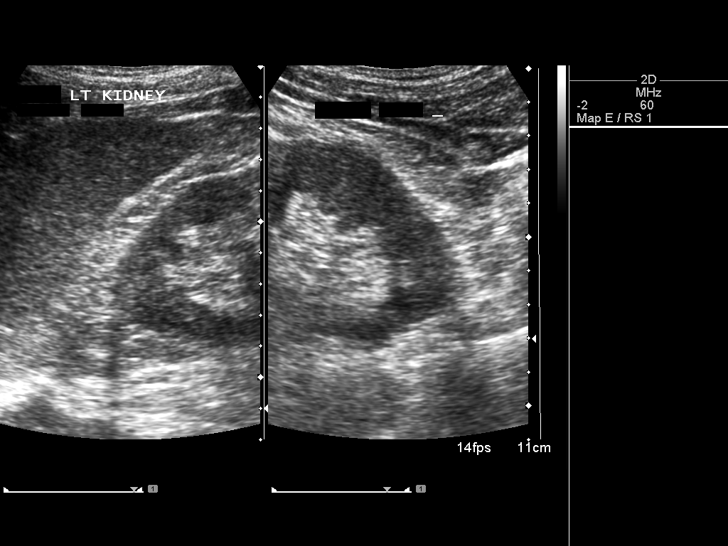
[im 23/26]
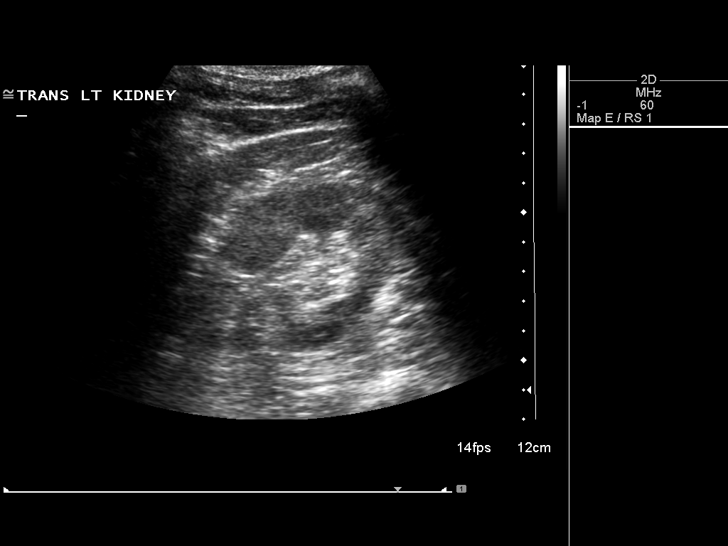
[im 26/26]
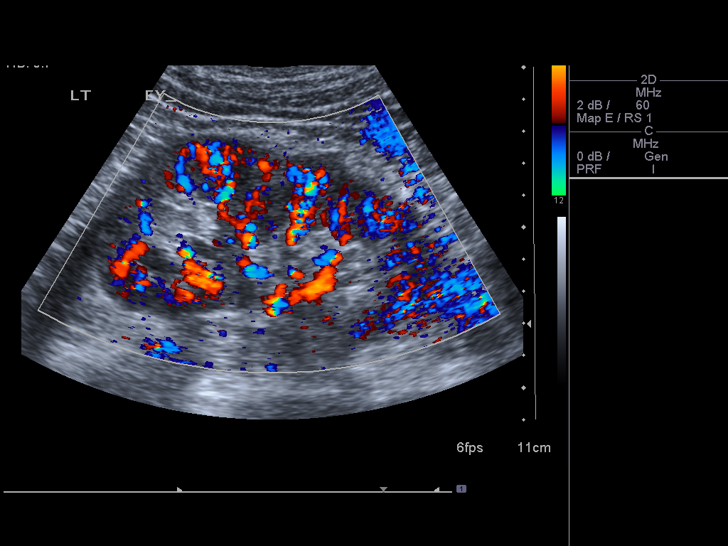

[14 of 25 positions shown; findings below may reference images not displayed]

FINDINGS: Right Kidney:

Length: 11.6 cm. The renal cortical echotexture remains lower than
that of the adjacent liver. No significant cortical atrophy is
observed. There is no hydronephrosis. No cystic or solid masses are
observed. No shadowing stones are demonstrated.

Left Kidney:

Length: 11.4 cm.. The renal cortical echotexture is similar to that
on the right. No significant cortical atrophy is observed. There is
no hydronephrosis. No cystic or solid masses are observed. No
shadowing stones are demonstrated.

Bladder:

Appears normal for degree of bladder distention.
IMPRESSION: Normal renal ultrasound examination.

## 2016-09-30 ENCOUNTER — Other Ambulatory Visit: Payer: Self-pay | Admitting: Internal Medicine

## 2016-09-30 DIAGNOSIS — B20 Human immunodeficiency virus [HIV] disease: Secondary | ICD-10-CM

## 2016-11-09 ENCOUNTER — Other Ambulatory Visit: Payer: Self-pay | Admitting: Medical

## 2016-12-01 ENCOUNTER — Other Ambulatory Visit: Payer: Self-pay | Admitting: Family Medicine

## 2016-12-03 ENCOUNTER — Ambulatory Visit (INDEPENDENT_AMBULATORY_CARE_PROVIDER_SITE_OTHER): Payer: BLUE CROSS/BLUE SHIELD | Admitting: Internal Medicine

## 2016-12-03 ENCOUNTER — Encounter: Payer: Self-pay | Admitting: Internal Medicine

## 2016-12-03 VITALS — BP 124/76 | HR 61 | Temp 97.5°F | Ht 69.0 in | Wt 140.0 lb

## 2016-12-03 DIAGNOSIS — E118 Type 2 diabetes mellitus with unspecified complications: Secondary | ICD-10-CM

## 2016-12-03 DIAGNOSIS — B20 Human immunodeficiency virus [HIV] disease: Secondary | ICD-10-CM | POA: Diagnosis not present

## 2016-12-03 MED ORDER — EMTRICITABINE-TENOFOVIR AF 200-25 MG PO TABS: 1.0000 | ORAL_TABLET | Freq: Every day | ORAL | 5 refills | Status: DC

## 2016-12-03 MED ORDER — DOLUTEGRAVIR SODIUM 50 MG PO TABS: 50.0000 mg | ORAL_TABLET | Freq: Every day | ORAL | 5 refills | Status: DC

## 2016-12-03 NOTE — Assessment & Plan Note (Signed)
He continues to work on his sugar control

## 2016-12-03 NOTE — Progress Notes (Signed)
   Subjective:    Patient ID: Cameron MurrayJames Ruhe, male    DOB: 11/23/1960, 56 y.o.   MRN: [id number]  HPI Here for follow up for HIV.      On Tivicay and Descovy and has no missed doses.  Feels well.  No weight loss, no diarrhea.  [facility] and will be under the care of someone there.    Review of Systems  Constitutional: Negative for fatigue.  Gastrointestinal: Negative for diarrhea and nausea.  Skin: Negative for rash.  Neurological: Negative for dizziness and light-headedness.       Objective:   Physical Exam  Constitutional: He appears well-developed and well-nourished. No distress.  HENT:  Some temporal wasting that is chronic  Eyes: No scleral icterus.  Cardiovascular: Normal rate, regular rhythm and normal heart sounds.   No murmur heard. Pulmonary/Chest: Effort normal and breath sounds normal. No respiratory distress. He has no wheezes.  Lymphadenopathy:    He has no cervical adenopathy.  Skin: No rash noted.          Assessment & Plan:

## 2016-12-03 NOTE — Assessment & Plan Note (Signed)
Doing welll.  Labs today and [facility].  6 months of refills sent to his pharmacy.

## 2016-12-04 LAB — T-HELPER CELL (CD4) - (RCID CLINIC ONLY)
CD4 % Helper T Cell: 20 % — ABNORMAL LOW (ref 33–55)
CD4 T Cell Abs: 340 /uL — ABNORMAL LOW (ref 400–2700)

## 2016-12-05 LAB — HIV-1 RNA QUANT-NO REFLEX-BLD
HIV 1 RNA Quant: <20 copies/mL
HIV-1 RNA Quant, Log: <1.3 Log copies/mL

## 2016-12-28 ENCOUNTER — Other Ambulatory Visit: Payer: Self-pay | Admitting: Medical

## 2016-12-31 ENCOUNTER — Ambulatory Visit (INDEPENDENT_AMBULATORY_CARE_PROVIDER_SITE_OTHER): Payer: BLUE CROSS/BLUE SHIELD | Admitting: Family Medicine

## 2016-12-31 ENCOUNTER — Encounter: Payer: Self-pay | Admitting: Family Medicine

## 2016-12-31 ENCOUNTER — Ambulatory Visit
Admission: RE | Admit: 2016-12-31 | Discharge: 2016-12-31 | Disposition: A | Discharge status: Home / Self Care | Payer: BLUE CROSS/BLUE SHIELD | Source: Ambulatory Visit | Attending: Family Medicine | Admitting: Family Medicine

## 2016-12-31 VITALS — BP 120/72 | HR 62 | Wt 146.0 lb

## 2016-12-31 DIAGNOSIS — E1169 Type 2 diabetes mellitus with other specified complication: Secondary | ICD-10-CM

## 2016-12-31 DIAGNOSIS — E559 Vitamin D deficiency, unspecified: Secondary | ICD-10-CM | POA: Insufficient documentation

## 2016-12-31 DIAGNOSIS — E785 Hyperlipidemia, unspecified: Secondary | ICD-10-CM | POA: Diagnosis not present

## 2016-12-31 DIAGNOSIS — E118 Type 2 diabetes mellitus with unspecified complications: Secondary | ICD-10-CM

## 2016-12-31 DIAGNOSIS — I10 Essential (primary) hypertension: Secondary | ICD-10-CM | POA: Diagnosis not present

## 2016-12-31 DIAGNOSIS — R0989 Other specified symptoms and signs involving the circulatory and respiratory systems: Secondary | ICD-10-CM | POA: Diagnosis not present

## 2016-12-31 LAB — CBC WITH DIFFERENTIAL/PLATELET
BASOS PCT: 0 %
Basophils Absolute: 0 cells/uL (ref 0–200)
Eosinophils Absolute: 245 cells/uL (ref 15–500)
Eosinophils Relative: 5 %
HEMATOCRIT: 38.7 % (ref 38.5–50.0)
HEMOGLOBIN: 12.8 g/dL — AB (ref 13.2–17.1)
LYMPHS ABS: 1323 {cells}/uL (ref 850–3900)
LYMPHS PCT: 27 %
MCH: 31 pg (ref 27.0–33.0)
MCHC: 33.1 g/dL (ref 32.0–36.0)
MCV: 93.7 fL (ref 80.0–100.0)
MONO ABS: 490 {cells}/uL (ref 200–950)
MPV: 11.2 fL (ref 7.5–12.5)
Monocytes Relative: 10 %
NEUTROS ABS: 2842 {cells}/uL (ref 1500–7800)
Neutrophils Relative %: 58 %
Platelets: 177 10*3/uL (ref 140–400)
RBC: 4.13 MIL/uL — AB (ref 4.20–5.80)
RDW: 13.5 % (ref 11.0–15.0)
WBC: 4.9 10*3/uL (ref 4.0–10.5)

## 2016-12-31 LAB — POCT GLYCOSYLATED HEMOGLOBIN (HGB A1C): Hemoglobin A1C: 6.6

## 2016-12-31 NOTE — Patient Instructions (Signed)
Your hemoglobin A1c is 6.6% today which is stable and controlled. Continue on current medications.  We will call you with your XR and lab results.

## 2016-12-31 NOTE — Progress Notes (Signed)
Subjective:    Patient ID: Cameron Humphrey, male    DOB: 08/04/1960, 56 y.o.   MRN: [id number]  Cameron Humphrey is a 56 y.o. male who presents for follow-up of Type 2 diabetes mellitus, other chronic health conditions and for a new acute complaint.   Patient is not checking home blood sugars.   Home blood sugar records: patient does not check sugars How often is blood sugars being checked: rarely  Current symptoms include: none. Patient denies foot ulcerations, hyperglycemia, hypoglycemia , increased appetite, nausea, paresthesia of the feet, polydipsia, polyuria, visual disturbances, vomiting and weight loss.  Patient is checking their feet daily. Any Foot concerns (callous, ulcer, wound, thickened nails, toenail fungus, skin fungus, hammer toe): none Last dilated eye exam: overdue. Last year. Denies history of retinopathy   Current treatments: oral medications . Medication compliance: excellent  Current diet: in general, a "healthy" diet   Current exercise: walking Known diabetic complications: none   Reports good compliance with HIV medications, HTN and cholesterol medications. No side effects reported.   He complains of a 1 month history of feeling like something is stuck in his throat. Denies food getting stuck. He does report getting "choked" occasionally when drinking liquids. This has been ongoing for at least the same length of time.  Denies reflux. Denies cough or post nasal drainage.  No fever, chills, sore throat, rhinorrhea, ear pain, nasal congestion, chest pain, palpitations, shortness of breath, wheezing, difficulty swallowing abdominal pain, N/V/D.   The following portions of the patient's history were reviewed and updated as appropriate: allergies, current medications, past medical history, past social history and problem list.  ROS as in subjective above.     Objective:    Physical Exam Alert and in no distress. No sinus tenderness, nares patent and no drainage.  Tympanic membranes and canals are normal. Pharyngeal area is normal. Neck is supple without adenopathy or thyromegaly. No stridor. Cardiac exam shows a regular sinus rhythm without murmurs or gallops. Lungs are clear to auscultation.  Blood pressure 120/72, pulse 62, weight 146 lb (66.2 kg).  Lab Review Diabetic Labs Latest Ref Rng & Units 12/31/2016 06/07/2016 03/06/2016 06/20/2015 06/09/2015  HbA1c - 6.6% 6.4 6.6(H) - 5.8(H)  Microalbumin Not estab mg/dL - - 0.6 - -  Micro/Creat Ratio <30 mcg/mg creat - - 9 - -  Chol <200 mg/dL - 409139 811150 - 914193  HDL >78>40 mg/dL - 29(F24(L) 62(Z25(L) - 30(Q35(L)  Calc LDL <100 mg/dL - 56 69 - 657118  Triglycerides <150 mg/dL - 846(N293(H) 629(B282(H) - 284(X202(H)  Creatinine 0.70 - 1.33 mg/dL - 3.24(M1.51(H) - 0.10(U2.40(H) 7.25(D2.04(H)   BP/Weight 12/31/2016 12/03/2016 06/07/2016 06/06/2016 03/06/2016  Systolic BP 120 124 130 133 120  Diastolic BP 72 76 70 76 80  Wt. (Lbs) 146 140 145.8 144 144.8  BMI 21.56 20.67 21.53 21.27 21.38   Foot/eye exam completion dates Latest Ref Rng & Units 03/06/2016 08/22/2015  Eye Exam No Retinopathy - No Retinopathy  Foot Form Completion - Done -    Cameron Humphrey  reports that he has never smoked. He has never used smokeless tobacco. He reports that he does not drink alcohol or use drugs.     Assessment & Plan:    Diabetes mellitus with complication (HCC) - Plan: HgB A1c, Comprehensive metabolic panel, CBC with Differential/Platelet, Microalbumin/Creatinine Ratio, Urine  Hyperlipidemia associated with type 2 diabetes mellitus (HCC) - Plan: Lipid panel  Essential hypertension, benign - Plan: Comprehensive metabolic panel, CBC with Differential/Platelet  Sensation of foreign  body in throat - Plan: Ambulatory referral to ENT, DG Neck Soft Tissue  Sensation, choking - Plan: Ambulatory referral to ENT, DG Neck Soft Tissue  Vitamin D deficiency - Plan: VITAMIN D 25 Hydroxy (Vit-D Deficiency, Fractures)  1. Rx changes: none Hgb A1c 6.6% today and within goal. No hypoglycemic or  hyerpglycemia episodes per patient.  2. Education: Reviewed 'ABCs' of diabetes management (respective goals in parentheses):  A1C (<7), blood pressure (<130/80), and cholesterol (LDL <100). 3. Compliance at present is estimated to be good. Efforts to improve compliance (if necessary) will be directed at increased exercise. 4. Follow up: as needed if unable to find a new PCP in the next 3-4 months in [place]. he is moving.  will forwards records when requests received.   5. HTN and cholesterol- no issues with medications. BP at goal. Will check fasting lipids today. Counseled on healthy lifestyle.  6. History of vitamin D deficiency and he stopped taking vitamin D in the past 6 months. Will recheck vitamin D level.  7. For globus sensation- will send for XR and refer to ENT per patient request.

## 2017-01-01 LAB — LIPID PANEL
CHOLESTEROL: 153 mg/dL (ref <200)
HDL: 23 mg/dL — AB (ref >40)
LDL Cholesterol: 70 mg/dL (ref <100)
TRIGLYCERIDES: 299 mg/dL — AB (ref <150)
Total CHOL/HDL Ratio: 6.7 Ratio — ABNORMAL HIGH (ref <5.0)
VLDL: 60 mg/dL — AB (ref <30)

## 2017-01-01 LAB — COMPREHENSIVE METABOLIC PANEL
ALBUMIN: 4.7 g/dL (ref 3.6–5.1)
ALT: 24 U/L (ref 9–46)
AST: 22 U/L (ref 10–35)
Alkaline Phosphatase: 38 U/L — ABNORMAL LOW (ref 40–115)
BUN: 32 mg/dL — ABNORMAL HIGH (ref 7–25)
CHLORIDE: 103 mmol/L (ref 98–110)
CO2: 22 mmol/L (ref 20–31)
Calcium: 9.3 mg/dL (ref 8.6–10.3)
Creat: 1.63 mg/dL — ABNORMAL HIGH (ref 0.70–1.33)
Glucose, Bld: 140 mg/dL — ABNORMAL HIGH (ref 65–99)
POTASSIUM: 4.4 mmol/L (ref 3.5–5.3)
Sodium: 136 mmol/L (ref 135–146)
TOTAL PROTEIN: 7.6 g/dL (ref 6.1–8.1)
Total Bilirubin: 0.5 mg/dL (ref 0.2–1.2)

## 2017-01-01 LAB — VITAMIN D 25 HYDROXY (VIT D DEFICIENCY, FRACTURES): Vit D, 25-Hydroxy: 28 ng/mL — ABNORMAL LOW (ref 30–100)

## 2017-01-01 LAB — MICROALBUMIN / CREATININE URINE RATIO
Creatinine, Urine: 40 mg/dL (ref 20–370)
MICROALB UR: 0.7 mg/dL
MICROALB/CREAT RATIO: 18 ug/mg{creat} (ref <30)

## 2017-01-02 ENCOUNTER — Encounter: Payer: Self-pay | Admitting: Family Medicine

## 2017-01-24 ENCOUNTER — Encounter: Payer: Self-pay | Admitting: Internal Medicine

## 2017-02-21 ENCOUNTER — Encounter: Payer: Self-pay | Admitting: Family Medicine

## 2017-02-23 ENCOUNTER — Other Ambulatory Visit: Payer: Self-pay | Admitting: Medical

## 2017-02-23 ENCOUNTER — Other Ambulatory Visit: Payer: Self-pay | Admitting: Family Medicine

## 2017-03-03 ENCOUNTER — Encounter: Payer: Self-pay | Admitting: Family Medicine

## 2017-03-03 ENCOUNTER — Other Ambulatory Visit: Payer: Self-pay | Admitting: Family Medicine

## 2017-03-03 MED ORDER — BENAZEPRIL HCL 10 MG PO TABS: 10.0000 mg | ORAL_TABLET | Freq: Every day | ORAL | 0 refills | Status: DC

## 2017-03-03 NOTE — Telephone Encounter (Signed)
Refilled meds for pt 

## 2017-03-30 ENCOUNTER — Other Ambulatory Visit: Payer: Self-pay | Admitting: Family Medicine

## 2017-03-31 NOTE — Telephone Encounter (Signed)
Waiting on pt to get back to me  

## 2017-04-01 ENCOUNTER — Other Ambulatory Visit: Payer: Self-pay | Admitting: Family Medicine

## 2017-04-06 ENCOUNTER — Telehealth: Payer: Self-pay | Admitting: Family Medicine

## 2017-04-06 NOTE — Telephone Encounter (Signed)
P.A. JANUVIA

## 2017-05-17 ENCOUNTER — Encounter: Payer: Self-pay | Admitting: Internal Medicine

## 2017-05-19 NOTE — Telephone Encounter (Signed)
It may be that they want him to be on Cimduo? We can give him a co-pay card for Descovy if he needs it

## 2017-05-21 NOTE — Telephone Encounter (Signed)
Called pharmacy & went thru 10/7 for $5 & pt picked up already

## 2017-05-25 ENCOUNTER — Other Ambulatory Visit: Payer: Self-pay | Admitting: Medical

## 2017-06-01 ENCOUNTER — Other Ambulatory Visit: Payer: Self-pay | Admitting: Family Medicine

## 2017-06-03 ENCOUNTER — Telehealth: Payer: Self-pay | Admitting: Family Medicine

## 2017-06-03 NOTE — Telephone Encounter (Signed)
Rcvd a fax from [facility] stating that pt's insurance will only pay 90 day supply of Benazepril 10 mg

## 2017-06-03 NOTE — Telephone Encounter (Signed)
done

## 2017-06-16 ENCOUNTER — Other Ambulatory Visit: Payer: Self-pay | Admitting: Family Medicine

## 2017-06-18 ENCOUNTER — Encounter: Payer: Self-pay | Admitting: Family Medicine

## 2017-06-29 ENCOUNTER — Other Ambulatory Visit: Payer: Self-pay | Admitting: Internal Medicine

## 2017-06-29 ENCOUNTER — Other Ambulatory Visit: Payer: Self-pay | Admitting: Family Medicine

## 2017-06-29 DIAGNOSIS — B20 Human immunodeficiency virus [HIV] disease: Secondary | ICD-10-CM

## 2017-06-30 NOTE — Telephone Encounter (Signed)
Please find out if he has moved, established with new PCP if so? We can refill if not for 3 months and then he will need a visit before the next refill.

## 2017-07-15 ENCOUNTER — Encounter: Payer: Self-pay | Admitting: Internal Medicine

## 2017-07-15 ENCOUNTER — Encounter: Payer: Self-pay | Admitting: Family Medicine

## 2017-07-15 NOTE — Telephone Encounter (Signed)
Called pt he has not moved yet,  He does have a pcp appt Jan [place]th [place].  He needs rx until then.  Will refill

## 2017-07-21 ENCOUNTER — Telehealth: Payer: Self-pay | Admitting: Family Medicine

## 2017-07-21 MED ORDER — SERTRALINE HCL 100 MG PO TABS: ORAL_TABLET | ORAL | 1 refills | Status: AC

## 2017-07-21 NOTE — Telephone Encounter (Signed)
Recv'd fax from [facility] that pt's insurance requires 90 days, please send in another Rx for #[phone]

## 2017-08-07 ENCOUNTER — Other Ambulatory Visit: Payer: Self-pay | Admitting: Internal Medicine

## 2017-08-07 DIAGNOSIS — B20 Human immunodeficiency virus [HIV] disease: Secondary | ICD-10-CM

## 2017-09-01 ENCOUNTER — Other Ambulatory Visit: Payer: Self-pay | Admitting: Medical

## 2017-09-06 ENCOUNTER — Other Ambulatory Visit: Payer: Self-pay | Admitting: Family Medicine

## 2017-09-22 ENCOUNTER — Other Ambulatory Visit: Payer: Self-pay | Admitting: Family Medicine

## 2017-09-22 NOTE — Telephone Encounter (Signed)
Pt moved to [place] 

## 2018-02-02 ENCOUNTER — Other Ambulatory Visit: Payer: Self-pay | Admitting: Family Medicine

## 2018-02-02 NOTE — Telephone Encounter (Signed)
[facility] is requesting to fill pt zoloft. Please advise KH 

## 2018-02-02 NOTE — Telephone Encounter (Signed)
yours

## 2018-09-14 ENCOUNTER — Encounter: Payer: Self-pay | Admitting: Family Medicine

## 2021-02-14 ENCOUNTER — Encounter: Payer: Self-pay | Admitting: Internal Medicine

## 2022-05-22 ENCOUNTER — Encounter: Payer: Self-pay | Admitting: Internal Medicine
